# Patient Record
Sex: Female | Born: 1970 | Race: White | Hispanic: No | Marital: Single | Smoking: Current every day smoker
Health system: Southern US, Community
[De-identification: ages and names within clinical notes are randomized; demographics above are authoritative.]

## PROBLEM LIST (undated history)

## (undated) DIAGNOSIS — F419 Anxiety disorder, unspecified: Secondary | ICD-10-CM

## (undated) DIAGNOSIS — F410 Panic disorder [episodic paroxysmal anxiety] without agoraphobia: Secondary | ICD-10-CM

## (undated) DIAGNOSIS — F32A Depression, unspecified: Secondary | ICD-10-CM

## (undated) DIAGNOSIS — F1990 Other psychoactive substance use, unspecified, uncomplicated: Secondary | ICD-10-CM

## (undated) DIAGNOSIS — M5126 Other intervertebral disc displacement, lumbar region: Secondary | ICD-10-CM

## (undated) DIAGNOSIS — Z789 Other specified health status: Secondary | ICD-10-CM

## (undated) DIAGNOSIS — B1920 Unspecified viral hepatitis C without hepatic coma: Secondary | ICD-10-CM

## (undated) DIAGNOSIS — F329 Major depressive disorder, single episode, unspecified: Secondary | ICD-10-CM

## (undated) DIAGNOSIS — Z720 Tobacco use: Secondary | ICD-10-CM

## (undated) HISTORY — PX: CHOLECYSTECTOMY: SHX55

## (undated) HISTORY — PX: KNEE SURGERY: SHX244

---

## 2008-11-21 ENCOUNTER — Emergency Department (HOSPITAL_COMMUNITY): Admission: EM | Admit: 2008-11-21 | Discharge: 2008-11-21 | Payer: Self-pay | Admitting: Emergency Medicine

## 2008-12-10 ENCOUNTER — Emergency Department (HOSPITAL_BASED_OUTPATIENT_CLINIC_OR_DEPARTMENT_OTHER): Admission: EM | Admit: 2008-12-10 | Discharge: 2008-12-11 | Payer: Self-pay | Admitting: Emergency Medicine

## 2008-12-10 ENCOUNTER — Ambulatory Visit: Payer: Self-pay | Admitting: Diagnostic Radiology

## 2008-12-23 ENCOUNTER — Ambulatory Visit: Payer: Self-pay | Admitting: Diagnostic Radiology

## 2008-12-23 ENCOUNTER — Emergency Department (HOSPITAL_BASED_OUTPATIENT_CLINIC_OR_DEPARTMENT_OTHER): Admission: EM | Admit: 2008-12-23 | Discharge: 2008-12-23 | Payer: Self-pay | Admitting: Emergency Medicine

## 2009-01-28 DIAGNOSIS — M5126 Other intervertebral disc displacement, lumbar region: Secondary | ICD-10-CM

## 2009-01-28 HISTORY — DX: Other intervertebral disc displacement, lumbar region: M51.26

## 2009-09-04 ENCOUNTER — Emergency Department (HOSPITAL_BASED_OUTPATIENT_CLINIC_OR_DEPARTMENT_OTHER): Admission: EM | Admit: 2009-09-04 | Discharge: 2009-09-04 | Payer: Self-pay | Admitting: Emergency Medicine

## 2009-11-30 ENCOUNTER — Emergency Department (HOSPITAL_BASED_OUTPATIENT_CLINIC_OR_DEPARTMENT_OTHER): Admission: EM | Admit: 2009-11-30 | Discharge: 2009-11-30 | Payer: Self-pay | Admitting: Emergency Medicine

## 2009-11-30 ENCOUNTER — Ambulatory Visit: Payer: Self-pay | Admitting: Diagnostic Radiology

## 2009-12-12 ENCOUNTER — Emergency Department (HOSPITAL_BASED_OUTPATIENT_CLINIC_OR_DEPARTMENT_OTHER): Admission: EM | Admit: 2009-12-12 | Discharge: 2009-12-12 | Payer: Self-pay | Admitting: Emergency Medicine

## 2010-01-23 ENCOUNTER — Emergency Department (HOSPITAL_BASED_OUTPATIENT_CLINIC_OR_DEPARTMENT_OTHER)
Admission: EM | Admit: 2010-01-23 | Discharge: 2010-01-23 | Payer: Self-pay | Source: Home / Self Care | Admitting: Emergency Medicine

## 2010-04-10 LAB — URINALYSIS, ROUTINE W REFLEX MICROSCOPIC
Bilirubin Urine: NEGATIVE
Ketones, ur: NEGATIVE mg/dL
Nitrite: NEGATIVE
Specific Gravity, Urine: 1.01 (ref 1.005–1.030)
pH: 7 (ref 5.0–8.0)

## 2010-04-10 LAB — BASIC METABOLIC PANEL
BUN: 14 mg/dL (ref 6–23)
Chloride: 106 mEq/L (ref 96–112)
Creatinine, Ser: 0.8 mg/dL (ref 0.4–1.2)
GFR calc non Af Amer: >60 mL/min (ref >60)
Glucose, Bld: 101 mg/dL — ABNORMAL HIGH (ref 70–99)
Potassium: 4.2 mEq/L (ref 3.5–5.1)

## 2010-04-10 LAB — URINE MICROSCOPIC-ADD ON

## 2010-04-10 LAB — PREGNANCY, URINE: Preg Test, Ur: NEGATIVE

## 2010-05-02 LAB — POCT CARDIAC MARKERS
CKMB, poc: 1.5 ng/mL (ref 1.0–8.0)
CKMB, poc: 1.8 ng/mL (ref 1.0–8.0)
Myoglobin, poc: 44.7 ng/mL (ref 12–200)
Myoglobin, poc: 52.9 ng/mL (ref 12–200)
Myoglobin, poc: 97 ng/mL (ref 12–200)
Troponin i, poc: <0.05 ng/mL (ref 0.00–0.09)
Troponin i, poc: <0.05 ng/mL (ref 0.00–0.09)

## 2010-05-02 LAB — URINALYSIS, ROUTINE W REFLEX MICROSCOPIC
Bilirubin Urine: NEGATIVE
Glucose, UA: NEGATIVE mg/dL
Ketones, ur: NEGATIVE mg/dL
Nitrite: NEGATIVE
Protein, ur: NEGATIVE mg/dL
Urobilinogen, UA: 0.2 mg/dL (ref 0.0–1.0)
pH: 6.5 (ref 5.0–8.0)

## 2010-05-02 LAB — COMPREHENSIVE METABOLIC PANEL
ALT: 29 U/L (ref 0–35)
AST: 22 U/L (ref 0–37)
Alkaline Phosphatase: 49 U/L (ref 39–117)
CO2: 23 mEq/L (ref 19–32)
Chloride: 105 mEq/L (ref 96–112)
GFR calc Af Amer: >60 mL/min (ref >60)
GFR calc non Af Amer: >60 mL/min (ref >60)
Glucose, Bld: 81 mg/dL (ref 70–99)
Potassium: 4.2 mEq/L (ref 3.5–5.1)
Sodium: 140 mEq/L (ref 135–145)
Total Bilirubin: 0.3 mg/dL (ref 0.3–1.2)

## 2010-05-02 LAB — DIFFERENTIAL
Basophils Absolute: 0.3 10*3/uL — ABNORMAL HIGH (ref 0.0–0.1)
Basophils Relative: 3 % — ABNORMAL HIGH (ref 0–1)
Eosinophils Absolute: 0.1 10*3/uL (ref 0.0–0.7)
Eosinophils Relative: 1 % (ref 0–5)
Lymphs Abs: 2 10*3/uL (ref 0.7–4.0)
Neutrophils Relative %: 70 % (ref 43–77)

## 2010-05-02 LAB — CBC
Hemoglobin: 12.7 g/dL (ref 12.0–15.0)
MCHC: 34.8 g/dL (ref 30.0–36.0)
RBC: 4.03 MIL/uL (ref 3.87–5.11)
RDW: 12.6 % (ref 11.5–15.5)
WBC: 10 10*3/uL (ref 4.0–10.5)

## 2010-05-02 LAB — LIPASE, BLOOD: Lipase: 56 U/L (ref 23–300)

## 2010-05-18 ENCOUNTER — Emergency Department (INDEPENDENT_AMBULATORY_CARE_PROVIDER_SITE_OTHER): Payer: Medicaid Other

## 2010-05-18 ENCOUNTER — Emergency Department (HOSPITAL_BASED_OUTPATIENT_CLINIC_OR_DEPARTMENT_OTHER)
Admission: EM | Admit: 2010-05-18 | Discharge: 2010-05-18 | Disposition: A | Discharge status: Home / Self Care | Payer: Medicaid Other | Attending: Emergency Medicine | Admitting: Emergency Medicine

## 2010-05-18 DIAGNOSIS — Y9341 Activity, dancing: Secondary | ICD-10-CM

## 2010-05-18 DIAGNOSIS — G8929 Other chronic pain: Secondary | ICD-10-CM | POA: Insufficient documentation

## 2010-05-18 DIAGNOSIS — F411 Generalized anxiety disorder: Secondary | ICD-10-CM | POA: Insufficient documentation

## 2010-05-18 DIAGNOSIS — W19XXXA Unspecified fall, initial encounter: Secondary | ICD-10-CM

## 2010-05-18 DIAGNOSIS — S20229A Contusion of unspecified back wall of thorax, initial encounter: Secondary | ICD-10-CM

## 2010-05-18 DIAGNOSIS — Y92009 Unspecified place in unspecified non-institutional (private) residence as the place of occurrence of the external cause: Secondary | ICD-10-CM | POA: Insufficient documentation

## 2010-08-23 ENCOUNTER — Emergency Department (HOSPITAL_BASED_OUTPATIENT_CLINIC_OR_DEPARTMENT_OTHER)
Admission: EM | Admit: 2010-08-23 | Discharge: 2010-08-23 | Disposition: A | Discharge status: Home / Self Care | Payer: Medicaid Other | Attending: Emergency Medicine | Admitting: Emergency Medicine

## 2010-08-23 ENCOUNTER — Emergency Department (INDEPENDENT_AMBULATORY_CARE_PROVIDER_SITE_OTHER): Payer: Medicaid Other

## 2010-08-23 DIAGNOSIS — M25579 Pain in unspecified ankle and joints of unspecified foot: Secondary | ICD-10-CM

## 2010-08-23 DIAGNOSIS — W19XXXA Unspecified fall, initial encounter: Secondary | ICD-10-CM

## 2010-08-23 DIAGNOSIS — R209 Unspecified disturbances of skin sensation: Secondary | ICD-10-CM | POA: Insufficient documentation

## 2010-08-23 DIAGNOSIS — M7989 Other specified soft tissue disorders: Secondary | ICD-10-CM

## 2010-08-23 DIAGNOSIS — S93409A Sprain of unspecified ligament of unspecified ankle, initial encounter: Secondary | ICD-10-CM | POA: Insufficient documentation

## 2010-08-23 DIAGNOSIS — S93401A Sprain of unspecified ligament of right ankle, initial encounter: Secondary | ICD-10-CM

## 2010-08-23 DIAGNOSIS — M1710 Unilateral primary osteoarthritis, unspecified knee: Secondary | ICD-10-CM

## 2010-08-23 DIAGNOSIS — M25569 Pain in unspecified knee: Secondary | ICD-10-CM

## 2010-08-23 DIAGNOSIS — X500XXA Overexertion from strenuous movement or load, initial encounter: Secondary | ICD-10-CM | POA: Insufficient documentation

## 2010-08-23 MED ORDER — HYDROCODONE-ACETAMINOPHEN 5-325 MG PO TABS
1.0000 | ORAL_TABLET | Freq: Four times a day (QID) | ORAL | Status: AC | PRN
Start: 2010-08-23 — End: 2010-09-02

## 2010-08-23 NOTE — ED Notes (Signed)
Socks and PO fluids provided.  Pt resting quietly.

## 2010-08-23 NOTE — ED Notes (Signed)
MD at bedside. 

## 2010-08-23 NOTE — ED Notes (Signed)
Injury to right ankle 3 weeks ago.  Pt reports increase in pain and swelling to lateral aspect of ankle that increases with weight bearing.

## 2010-08-23 NOTE — ED Provider Notes (Addendum)
History     Chief Complaint  Patient presents with  . Ankle Pain   Patient is a 40 y.o. female presenting with ankle pain. The history is provided by the patient.  Ankle Pain  The incident occurred more than 1 week ago (It occurred the beginning of July). The injury mechanism was torsion. The pain is present in the right ankle. The pain is moderate. The pain has been constant since onset. Associated symptoms include loss of motion and tingling. Pertinent negatives include no numbness, no inability to bear weight and no muscle weakness. She has tried rest and NSAIDs for the symptoms.    No past medical history on file.  No past surgical history on file.  No family history on file.  History  Substance Use Topics  . Smoking status: Not on file  . Smokeless tobacco: Not on file  . Alcohol Use: Not on file    OB History    No data available      Review of Systems  Neurological: Positive for tingling. Negative for numbness.  All other systems reviewed and are negative.    Physical Exam  BP 118/84  Pulse 82  Temp(Src) 99.5 F (37.5 C) (Oral)  Resp 16  Ht 5\' 3"  (1.6 m)  Wt 126 lb (57.153 kg)  BMI 22.32 kg/m2  SpO2 97%  LMP 07/24/2010  Physical Exam  Nursing note and vitals reviewed. Constitutional: She appears well-developed and well-nourished. No distress.  HENT:  Head: Normocephalic and atraumatic.  Right Ear: External ear normal.  Left Ear: External ear normal.  Eyes: Conjunctivae are normal. Right eye exhibits no discharge. Left eye exhibits no discharge. No scleral icterus.  Neck: Neck supple. No tracheal deviation present.  Pulmonary/Chest: Effort normal. No stridor. No respiratory distress.  Musculoskeletal: She exhibits edema and tenderness.       ttp lateral maleolus right ankle, no prox fib ttp, distal nv intact  Neurological: She is alert. Cranial nerve deficit: no gross deficits.  Skin: Skin is warm and dry. No rash noted.  Psychiatric: She has a  normal mood and affect.    ED Course  Procedures Nursing notes,, and x-ray interpretations were reviewed and considered as part of the evaluation and were factored into the medical decision making. Labs Reviewed - No data to display MDM Patient without signs of fracture or dislocation. Explained the results of her knee x-rays to her. Patient states that she did have an old injury in the past and suspect this is the cause for her osteoarthritis. Suspect patient has somewhat of a chronic sprain injury. We will immobilize her and give her crutches. We'll give her a referral to sports medicine.      Celene Kras, MD 08/23/10 1610  Celene Kras, MD 09/21/10 [phone]

## 2010-09-06 ENCOUNTER — Ambulatory Visit: Payer: Self-pay | Admitting: Family Medicine

## 2010-09-06 ENCOUNTER — Ambulatory Visit (INDEPENDENT_AMBULATORY_CARE_PROVIDER_SITE_OTHER): Payer: Medicaid Other | Admitting: Family Medicine

## 2010-09-06 ENCOUNTER — Encounter: Payer: Self-pay | Admitting: Family Medicine

## 2010-09-06 DIAGNOSIS — S99919A Unspecified injury of unspecified ankle, initial encounter: Secondary | ICD-10-CM

## 2010-09-06 DIAGNOSIS — S8991XA Unspecified injury of right lower leg, initial encounter: Secondary | ICD-10-CM

## 2010-09-06 DIAGNOSIS — S99911A Unspecified injury of right ankle, initial encounter: Secondary | ICD-10-CM

## 2010-09-06 NOTE — Patient Instructions (Signed)
The biggest barrier at this point to further workup (MRI) and treatment (physical therapy) is medicaid going through. Once this goes through, call us and we'll talk about next steps. Your ankle pain and exam is consistent with a severe ankle sprain that has not been adequately rehabilitated to this point. Start simple up/down and alphabet exercises 2 sets twice a day out of boot. Ice area 15 minutes at a time 3-4 times a day. ACE wrap can help with swelling under the boot. Crutches as needed. Aleve 2 tabs twice a day with food for pain, swelling, and inflammation.  Your knee pain being diffuse makes discerning the problem difficult. Start with some quad sets and straight leg raises 3 sets of 15 once a day. We will likely have to move forward with MRI of this at some point - typically after 6 weeks of PT/home exercises if you aren't improving. Ice area 15 minutes at a time 3-4 times a day. ACE wrap can help with swelling under the boot. Crutches as needed.

## 2010-09-10 ENCOUNTER — Encounter: Payer: Self-pay | Admitting: Family Medicine

## 2010-09-10 NOTE — Progress Notes (Signed)
Subjective:    Patient ID: Alexa Sims, female    DOB: Jun 09, 1970, 40 y.o.   MRN: [id number]  PCP: Dr. Quintella Reichert  HPI 40 yo F here for right knee and ankle pain.  Patient reports sustaining initial injury on 07/30/10. She was moving a mattress out a door on steep steps when she suffered an inversion injury to right ankle, felt her right knee go medial as well. States both the ankle and knee became swollen as a result, couldn't walk well following this. Went to ED where had ankle, tib-fib and knee x-rays that were negative on 7/26. She has a history of meniscectomy by Dr. Alvera Novel in the past. Reports pain, catching, and locking of right knee mostly on inside of knee.  Hears a tearing/popping noise in knee also. Toes tingling and bottom of foot is numb.  History reviewed. No pertinent past medical history.  Current Outpatient Prescriptions on File Prior to Visit  Medication Sig Dispense Refill  . ibuprofen (ADVIL,MOTRIN) 800 MG tablet Take 800 mg by mouth every 8 (eight) hours as needed.          Past Surgical History  Procedure Date  . Cholecystectomy   . Knee surgery     partial meniscectomy right knee    Allergies  Allergen Reactions  . Aspirin     Bothers stomach  . Tramadol Nausea And Vomiting    History   Social History  . Marital Status: Single    Spouse Name: N/A    Number of Children: N/A  . Years of Education: N/A   Occupational History  . Not on file.   Social History Main Topics  . Smoking status: Current Everyday Smoker -- 1.0 packs/day  . Smokeless tobacco: Not on file  . Alcohol Use: Yes     socially  . Drug Use: No  . Sexually Active: Not on file   Other Topics Concern  . Not on file   Social History Narrative  . No narrative on file    Family History  Problem Relation Age of Onset  . Hyperlipidemia Father   . Hypertension Father   . Heart attack Father   . Rheum arthritis Sister     BP 108/70  Pulse 71  Temp(Src) 98.1 F (36.7  C) (Oral)  Ht 5\' 3"  (1.6 m)  Wt 134 lb 6.4 oz (60.963 kg)  BMI 23.81 kg/m2  LMP 07/24/2010  Review of Systems See HPI above.    Objective:   Physical Exam Gen: NAD  R knee: No gross deformity, ecchymoses, swelling. Diffuse anterior TTP including joint lines, post patellar facets. FROM but guarding with flexion. Negative ant/post drawers. Negative valgus/varus testing. Negative lachmanns. Pain with mcmurrays, apleys, patellar apprehension diffusely anterior knee.  No click with mcmurrays/apleys. NV intact distally.  R ankle: Mild swelling lateral ankle.  No other gross deformity, ecchymoses Able to move in all directions, mildly limited in all planes. TTP medial, lateral malleoli, talar dome, over ATFL, mild over Achilles - fairly diffuse tenderness ankle as well as knee as noted above. 1+ ant drawer and talar tilt.   Negative syndesmotic compression. Thompsons test negative. NV intact distally.  MSK u/s:  Right patellar tendon intact without thickening or neovascularity.  R ankle talar dome intact without fracture.  Post tib, achilles, peroneal tendons appear intact though peroneal tendons have edema surrounding them as they cross ankle joint.      Assessment & Plan:  1. R ankle injury - x-rays reviewed and are  negative.  Tendons appear intact on msk u/s though she does have swelling about peroneal tendons, lateral ankle.  2/2 ankle sprain.  Not having insurance a major barrier to care.  Would start PT at this point but she cannot do this yet - will call when medicaid goes through (should be about 2 weeks) - then will order PT.  Start simple ROM exercises at this point.  Cam walker for stability.  ACE under cam to help with swelling.  Icing, nsaids prn.  2. R knee injury - unable to discern her injury but no objective signs of intraarticular pathology.  Diffuse pain makes this difficult.  Radiographs reviewed and are normal.  Icing, nsaids.  Will likely have to move forward with  MRI when medicaid goes through.  Shown quad sets and straight leg raises to do daily.  ACE wrap for this as well.  Call when medicaid goes through.

## 2010-09-11 ENCOUNTER — Encounter: Payer: Self-pay | Admitting: Family Medicine

## 2010-09-11 DIAGNOSIS — S8991XA Unspecified injury of right lower leg, initial encounter: Secondary | ICD-10-CM | POA: Insufficient documentation

## 2010-09-11 DIAGNOSIS — S99911A Unspecified injury of right ankle, initial encounter: Secondary | ICD-10-CM | POA: Insufficient documentation

## 2010-09-11 NOTE — Assessment & Plan Note (Signed)
x-rays reviewed and are negative.  Tendons appear intact on msk u/s though she does have swelling about peroneal tendons, lateral ankle.  2/2 ankle sprain.  Not having insurance a major barrier to care.  Would start PT at this point but she cannot do this yet - will call when medicaid goes through (should be about 2 weeks) - then will order PT.  Start simple ROM exercises at this point.  Cam walker for stability.  ACE under cam to help with swelling.  Icing, nsaids prn.

## 2010-09-11 NOTE — Assessment & Plan Note (Signed)
unable to discern her injury but no objective signs of intraarticular pathology.  Diffuse pain makes this difficult.  Radiographs reviewed and are normal.  Icing, nsaids.  Will likely have to move forward with MRI when medicaid goes through.  Shown quad sets and straight leg raises to do daily.  ACE wrap for this as well.  Call when medicaid goes through.

## 2010-12-24 ENCOUNTER — Inpatient Hospital Stay (HOSPITAL_COMMUNITY)
Admission: AD | Admit: 2010-12-24 | Discharge: 2010-12-24 | Disposition: A | Discharge status: Home / Self Care | Payer: Medicaid Other | Source: Ambulatory Visit | Attending: Obstetrics and Gynecology | Admitting: Obstetrics and Gynecology

## 2010-12-24 ENCOUNTER — Encounter (HOSPITAL_COMMUNITY): Payer: Self-pay | Admitting: *Deleted

## 2010-12-24 DIAGNOSIS — N949 Unspecified condition associated with female genital organs and menstrual cycle: Secondary | ICD-10-CM

## 2010-12-24 DIAGNOSIS — R109 Unspecified abdominal pain: Secondary | ICD-10-CM | POA: Insufficient documentation

## 2010-12-24 DIAGNOSIS — O99891 Other specified diseases and conditions complicating pregnancy: Secondary | ICD-10-CM | POA: Insufficient documentation

## 2010-12-24 HISTORY — DX: Other specified health status: Z78.9

## 2010-12-24 LAB — URINALYSIS, ROUTINE W REFLEX MICROSCOPIC
Glucose, UA: NEGATIVE mg/dL
Leukocytes, UA: NEGATIVE
Protein, ur: NEGATIVE mg/dL
Specific Gravity, Urine: 1.02 (ref 1.005–1.030)
pH: 6 (ref 5.0–8.0)

## 2010-12-24 MED ORDER — HYDROXYZINE HCL 50 MG/ML IM SOLN
50.0000 mg | Freq: Once | INTRAMUSCULAR | Status: AC
  Administered 2010-12-24: 50 mg via INTRAMUSCULAR
  Filled 2010-12-24: qty 1

## 2010-12-24 MED ORDER — HYDROXYZINE HCL 50 MG/ML IM SOLN
50.0000 mg | Freq: Once | INTRAMUSCULAR | Status: DC
  Filled 2010-12-24: qty 1

## 2010-12-24 NOTE — ED Provider Notes (Signed)
History   Pt presents today c/o lower abd pain and cramping. She states the pain is worse with certain movements. She denies vag dc, bleeding, fever. She does c/o urinary frequency. She denies any other sx at this time.  Chief Complaint  Patient presents with  . Contractions   HPI  OB History    Grav Para Term Preterm Abortions TAB SAB Ect Mult Living   3 2        2       Past Medical History  Diagnosis Date  . No pertinent past medical history     Past Surgical History  Procedure Date  . Cholecystectomy   . Knee surgery     partial meniscectomy right knee    Family History  Problem Relation Age of Onset  . Hyperlipidemia Father   . Hypertension Father   . Heart attack Father   . Rheum arthritis Sister   . Sudden death Paternal Uncle   . Hypertension Maternal Grandmother   . Diabetes Neg Hx     History  Substance Use Topics  . Smoking status: Current Everyday Smoker -- 1.0 packs/day  . Smokeless tobacco: Not on file  . Alcohol Use: No     socially    Allergies:  Allergies  Allergen Reactions  . Aspirin     Bothers stomach  . Tramadol Nausea And Vomiting    Prescriptions prior to admission  Medication Sig Dispense Refill  . gabapentin (NEURONTIN) 100 MG capsule Take 100 mg by mouth daily.        Marland Kitchen HYDROcodone-acetaminophen (NORCO) 5-325 MG per tablet Take 1 tablet by mouth every 6 (six) hours as needed. For pain       . prenatal vitamin w/FE, FA (PRENATAL 1 + 1) 27-1 MG TABS Take 1 tablet by mouth daily.          Review of Systems  Constitutional: Negative for fever.  Cardiovascular: Negative for chest pain and palpitations.  Gastrointestinal: Positive for abdominal pain. Negative for nausea, vomiting, diarrhea and constipation.  Genitourinary: Positive for frequency. Negative for dysuria, urgency and hematuria.  Neurological: Negative for dizziness and headaches.  Psychiatric/Behavioral: Negative for depression and suicidal ideas.   Physical Exam    Blood pressure 121/71, pulse 77, temperature 98.1 F (36.7 C), resp. rate 16, height 5\' 3"  (1.6 m), weight 146 lb 3.2 oz (66.316 kg), last menstrual period 07/24/2010.  Physical Exam  Nursing note and vitals reviewed. Constitutional: She is oriented to person, place, and time. She appears well-developed and well-nourished. No distress.  HENT:  Head: Normocephalic and atraumatic.  Eyes: EOM are normal. Pupils are equal, round, and reactive to light.  GI: Soft. She exhibits no distension. There is no tenderness. There is no rebound and no guarding.  Genitourinary: No bleeding around the vagina. No vaginal discharge found.       Cervix Lg/closed.  Neurological: She is alert and oriented to person, place, and time.  Skin: Skin is warm and dry. She is not diaphoretic.  Psychiatric: She has a normal mood and affect. Her behavior is normal. Judgment and thought content normal.    MAU Course  Procedures  Discussed pt with Dr. Claiborne Billings. Ok to dc to home with precautions.  Results for orders placed during the hospital encounter of 12/24/10 (from the past 24 hour(s))  URINALYSIS, ROUTINE W REFLEX MICROSCOPIC     Status: Normal   Collection Time   12/24/10 10:45 PM      Component Value Range  Color, Urine YELLOW  YELLOW    Appearance CLEAR  CLEAR    Specific Gravity, Urine 1.020  1.005 - 1.030    pH 6.0  5.0 - 8.0    Glucose, UA NEGATIVE  NEGATIVE (mg/dL)   Hgb urine dipstick NEGATIVE  NEGATIVE    Bilirubin Urine NEGATIVE  NEGATIVE    Ketones, ur NEGATIVE  NEGATIVE (mg/dL)   Protein, ur NEGATIVE  NEGATIVE (mg/dL)   Urobilinogen, UA 0.2  0.0 - 1.0 (mg/dL)   Nitrite NEGATIVE  NEGATIVE    Leukocytes, UA NEGATIVE  NEGATIVE      Assessment and Plan  Round ligament pain: discussed with pt at length. She will f/u with her OB provider. Discussed diet, activity, risks, and precautions.  Clinton Gallant. Maxwel Meadowcroft III, DrHSc, MPAS, PA-C  12/24/2010, 11:19 PM   Henrietta Hoover, PA 12/24/10 2346

## 2010-12-24 NOTE — Progress Notes (Signed)
Pt G3 P2 at 17.2wks, having contractions since last pm.  Denies bleeding or discharge.

## 2010-12-26 ENCOUNTER — Other Ambulatory Visit: Payer: Self-pay | Admitting: Obstetrics and Gynecology

## 2010-12-26 ENCOUNTER — Ambulatory Visit: Payer: Medicaid Other | Attending: Obstetrics and Gynecology | Admitting: Physical Therapy

## 2010-12-31 ENCOUNTER — Other Ambulatory Visit (HOSPITAL_COMMUNITY): Payer: Self-pay | Admitting: Obstetrics and Gynecology

## 2010-12-31 DIAGNOSIS — O280 Abnormal hematological finding on antenatal screening of mother: Secondary | ICD-10-CM

## 2010-12-31 DIAGNOSIS — O09529 Supervision of elderly multigravida, unspecified trimester: Secondary | ICD-10-CM

## 2011-01-01 ENCOUNTER — Ambulatory Visit (HOSPITAL_COMMUNITY)
Admission: RE | Admit: 2011-01-01 | Discharge: 2011-01-01 | Disposition: A | Discharge status: Home / Self Care | Payer: Medicaid Other | Source: Ambulatory Visit | Attending: Obstetrics and Gynecology | Admitting: Obstetrics and Gynecology

## 2011-01-01 DIAGNOSIS — O280 Abnormal hematological finding on antenatal screening of mother: Secondary | ICD-10-CM

## 2011-01-01 DIAGNOSIS — O358XX0 Maternal care for other (suspected) fetal abnormality and damage, not applicable or unspecified: Secondary | ICD-10-CM | POA: Insufficient documentation

## 2011-01-01 DIAGNOSIS — O09529 Supervision of elderly multigravida, unspecified trimester: Secondary | ICD-10-CM | POA: Insufficient documentation

## 2011-01-01 DIAGNOSIS — O289 Unspecified abnormal findings on antenatal screening of mother: Secondary | ICD-10-CM | POA: Insufficient documentation

## 2011-01-01 DIAGNOSIS — Z1389 Encounter for screening for other disorder: Secondary | ICD-10-CM | POA: Insufficient documentation

## 2011-01-01 DIAGNOSIS — Z363 Encounter for antenatal screening for malformations: Secondary | ICD-10-CM | POA: Insufficient documentation

## 2011-01-01 NOTE — Progress Notes (Signed)
Alexa Sims was seen for ultrasound appointment today.  Please see [facility] report for details.

## 2011-01-01 NOTE — Progress Notes (Signed)
Genetic Counseling  High-Risk Gestation Note  Appointment Date:  01/01/2011 Referred By: Loney Laurence, MD Date of Birth:  Mar 19, 1970 Partner:  [name]  Pregnancy History: G3P2 Estimated Date of Delivery: 06/01/11 Estimated Gestational Age: [redacted]w[redacted]d Attending: Berna Spare, MD  Alexa Sims and her partner, Alexa Sims, were seen for genetic counseling regarding a maternal age of 40 y.o. and an increased risk for Down syndrome based on Quad screen.  They were counseled regarding maternal age and the association with risk for chromosome conditions due to nondisjunction with aging of the ova.   We reviewed chromosomes, nondisjunction, and the associated 1 in 43 risk for fetal aneuploidy in the second trimester related to a maternal age of 6 at delivery.  They were counseled that the risk for aneuploidy decreases as gestational age increases, accounting for those pregnancies which spontaneously abort.  We specifically discussed Down syndrome (trisomy 51), trisomies 40 and 70, and sex chromosome aneuploidies (47,XXX and 47,XXY) including the common features and prognoses of each.   We also reviewed Alexa Sims's maternal serum Quad screen result and the associated increase in risk for fetal Down syndrome (1 in 110 to 1 in 22).  They were counseled regarding other explanations for a screen positive result including normal variation and differences in maternal metabolism.  In addition, we reviewed the screen adjusted reduction in risks for trisomy 18 (less than 1 in 5000) and ONTDs (less than 1 in 5000).  They understand that Quad screening provides a pregnancy specific risk for Down syndrome, but is not considered to be diagnostic.  They were counseled regarding other available screening and diagnostic options including ultrasound and amniocentesis.  The risks, benefits, and limitations of each of these options were reviewed in detail.  After thoughtful consideration of these options, they  elected to proceed with ultrasound, but declined amniocentesis.  A complete detailed ultrasound was performed today.  The ultrasound report will be sent under separate cover.  They understand that screening tests cannot rule out all birth defects or genetic syndromes.  The patient was advised of this limitation and states she still does not want diagnostic testing at this time given the associated risk of complications.  However, they were counseled that 50-80% of fetuses with Down syndrome and up to 90% of fetuses with trisomies 13 and 18, when well visualized, have detectable anomalies or soft markers by ultrasound.    Alexa Sims was provided with written information regarding cystic fibrosis (CF) including the carrier frequency and incidence in the Caucasian population, the availability of carrier testing and prenatal diagnosis if indicated.  In addition, we discussed that CF is routinely screened for as part of the [place] newborn screening panel.  She declined testing today.   Both family histories were reviewed and found to be noncontributory for birth defects, mental retardation, and known genetic conditions. Without further information regarding the provided family history, an accurate genetic risk cannot be calculated. Further genetic counseling is warranted if more information is obtained.  Alexa Sims denied exposure to environmental toxins. She reported taking Neurontin and hydrocodone as needed for back pain and reported that she has not taken these recently. Although limited, current available data on prenatal use of hydrocodone during human pregnancy do not suggest an increased risk of birth defects. Available data regarding prenatal use of gabapentin are limited. Gabapentin has been reported to interfere with embryo development in some but not all experimental animal studies. Limited reports of gabapentin use in humans  include normal pregnancy outcomes in the majority but also include reports  of malformations. Data are currently too limited to elucidate whether prenatal gabapentin use increases the risk for birth defects.  She denied the use of alcohol or street drugs. She reported smoking approximately less than one cigarette per day. The associations of smoking in pregnancy were reviewed and cessation encouraged. She denied significant viral illnesses during the course of her pregnancy. Her medical and surgical histories were contributory for 1 TAB.     I counseled this couple regarding the above risks and available options.  The approximate face-to-face time with the genetic counselor was 30 minutes.    Quinn Plowman, MS,  Certified Genetic Counselor 01/01/2011

## 2011-01-02 NOTE — Progress Notes (Signed)
Encounter addended by: Marlana Latus, RN on: 01/02/2011 10:04 AM<BR>     Documentation filed: Episodes, Chief Complaint Section

## 2011-05-09 LAB — OB RESULTS CONSOLE GBS: GBS: NEGATIVE

## 2011-06-03 ENCOUNTER — Encounter (HOSPITAL_COMMUNITY): Payer: Self-pay | Admitting: *Deleted

## 2011-06-03 ENCOUNTER — Inpatient Hospital Stay (HOSPITAL_COMMUNITY)
Admission: AD | Admit: 2011-06-03 | Discharge: 2011-06-03 | Disposition: A | Discharge status: Home / Self Care | Payer: Medicaid Other | Source: Ambulatory Visit | Attending: Obstetrics and Gynecology | Admitting: Obstetrics and Gynecology

## 2011-06-03 DIAGNOSIS — O479 False labor, unspecified: Secondary | ICD-10-CM | POA: Insufficient documentation

## 2011-06-03 DIAGNOSIS — R109 Unspecified abdominal pain: Secondary | ICD-10-CM | POA: Insufficient documentation

## 2011-06-03 HISTORY — DX: Other intervertebral disc displacement, lumbar region: M51.26

## 2011-06-03 HISTORY — DX: Depression, unspecified: F32.A

## 2011-06-03 HISTORY — DX: Major depressive disorder, single episode, unspecified: F32.9

## 2011-06-03 NOTE — MAU Provider Note (Signed)
  History     CSN: [id number]  Arrival date and time: 06/03/11 1429   First Provider Initiated Contact with Patient 06/03/11 1538      Chief Complaint  Patient presents with  . Labor Eval   HPI Alexa Sims is 41 y.o. G3P2 [redacted]w[redacted]d weeks presenting with abdominal pain and now feels like she is contractions.  She feels like they are 10-15 minutes apart.  A little sharper than normal.  Ferning here is negative.   Patient is comfortable at the moment.  Dr. Tenny Craw is aware that patient is here and has given orders for her to walk.      Past Medical History  Diagnosis Date  . No pertinent past medical history   . Lumbar herniated disc 2011    L5    Past Surgical History  Procedure Date  . Cholecystectomy   . Knee surgery     partial meniscectomy right knee    Family History  Problem Relation Age of Onset  . Hyperlipidemia Father   . Hypertension Father   . Heart attack Father   . Rheum arthritis Sister   . Sudden death Paternal Uncle   . Hypertension Maternal Grandmother   . Diabetes Neg Hx   . Anesthesia problems Neg Hx   . Birth defects Brother     History  Substance Use Topics  . Smoking status: Current Everyday Smoker -- 0.2 packs/day    Types: Cigarettes  . Smokeless tobacco: Never Used  . Alcohol Use: No     socially    Allergies:  Allergies  Allergen Reactions  . Aspirin     Bothers stomach  . Tramadol Nausea And Vomiting and Other (See Comments)    Blurred vision.    Prescriptions prior to admission  Medication Sig Dispense Refill  . HYDROcodone-acetaminophen (NORCO) 5-325 MG per tablet Take 1 tablet by mouth every 6 (six) hours as needed. For pain       . Prenatal Vit-Fe Fumarate-FA (PRENATAL MULTIVITAMIN) TABS Take 1 tablet by mouth every morning.        ROS Physical Exam   Blood pressure 111/59, pulse 81, temperature 98.4 F (36.9 C), temperature source Oral, resp. rate 18, height 5\' 3"  (1.6 m), weight 71.668 kg (158 lb), last menstrual period  08/25/2010, SpO2 100.00%, unknown if currently breastfeeding.  Physical Exam  MAU Course  Procedures  MDM   Assessment and Plan    Alexa Sims,EVE M 06/03/2011, 3:38 PM

## 2011-06-03 NOTE — MAU Note (Signed)
Pt states she's been having sharp pains since 0600 this morning in lower abd and lower back pain.

## 2011-06-03 NOTE — Discharge Instructions (Signed)
Fetal Movement Counts Patient Name: __________________________________________________ Patient Due Date: ____________________ Kick counts is highly recommended in high risk pregnancies, but it is a good idea for every pregnant woman to do. Start counting fetal movements at 28 weeks of the pregnancy. Fetal movements increase after eating a full meal or eating or drinking something sweet (the blood sugar is higher). It is also important to drink plenty of fluids (well hydrated) before doing the count. Lie on your left side because it helps with the circulation or you can sit in a comfortable chair with your arms over your belly (abdomen) with no distractions around you. DOING THE COUNT  Try to do the count the same time of day each time you do it.   Mark the day and time, then see how long it takes for you to feel 10 movements (kicks, flutters, swishes, rolls). You should have at least 10 movements within 2 hours. You will most likely feel 10 movements in much less than 2 hours. If you do not, wait an hour and count again. After a couple of days you will see a pattern.   What you are looking for is a change in the pattern or not enough counts in 2 hours. Is it taking longer in time to reach 10 movements?  SEEK MEDICAL CARE IF:  You feel less than 10 counts in 2 hours. Tried twice.   No movement in one hour.   The pattern is changing or taking longer each day to reach 10 counts in 2 hours.   You feel the baby is not moving as it usually does.  Date: ____________ Movements: ____________ Start time: ____________ Finish time: ____________  Date: ____________ Movements: ____________ Start time: ____________ Finish time: ____________ Date: ____________ Movements: ____________ Start time: ____________ Finish time: ____________ Date: ____________ Movements: ____________ Start time: ____________ Finish time: ____________ Date: ____________ Movements: ____________ Start time: ____________ Finish time:  ____________ Date: ____________ Movements: ____________ Start time: ____________ Finish time: ____________ Date: ____________ Movements: ____________ Start time: ____________ Finish time: ____________ Date: ____________ Movements: ____________ Start time: ____________ Finish time: ____________  Date: ____________ Movements: ____________ Start time: ____________ Finish time: ____________ Date: ____________ Movements: ____________ Start time: ____________ Finish time: ____________ Date: ____________ Movements: ____________ Start time: ____________ Finish time: ____________ Date: ____________ Movements: ____________ Start time: ____________ Finish time: ____________ Date: ____________ Movements: ____________ Start time: ____________ Finish time: ____________ Date: ____________ Movements: ____________ Start time: ____________ Finish time: ____________ Date: ____________ Movements: ____________ Start time: ____________ Finish time: ____________  Date: ____________ Movements: ____________ Start time: ____________ Finish time: ____________ Date: ____________ Movements: ____________ Start time: ____________ Finish time: ____________ Date: ____________ Movements: ____________ Start time: ____________ Finish time: ____________ Date: ____________ Movements: ____________ Start time: ____________ Finish time: ____________ Date: ____________ Movements: ____________ Start time: ____________ Finish time: ____________ Date: ____________ Movements: ____________ Start time: ____________ Finish time: ____________ Date: ____________ Movements: ____________ Start time: ____________ Finish time: ____________  Date: ____________ Movements: ____________ Start time: ____________ Finish time: ____________ Date: ____________ Movements: ____________ Start time: ____________ Finish time: ____________ Date: ____________ Movements: ____________ Start time: ____________ Finish time: ____________ Date: ____________ Movements:  ____________ Start time: ____________ Finish time: ____________ Date: ____________ Movements: ____________ Start time: ____________ Finish time: ____________ Date: ____________ Movements: ____________ Start time: ____________ Finish time: ____________ Date: ____________ Movements: ____________ Start time: ____________ Finish time: ____________  Date: ____________ Movements: ____________ Start time: ____________ Finish time: ____________ Date: ____________ Movements: ____________ Start time: ____________ Finish time: ____________ Date: ____________ Movements: ____________ Start time:   ____________ Finish time: ____________ Date: ____________ Movements: ____________ Start time: ____________ Finish time: ____________ Date: ____________ Movements: ____________ Start time: ____________ Finish time: ____________ Date: ____________ Movements: ____________ Start time: ____________ Finish time: ____________ Date: ____________ Movements: ____________ Start time: ____________ Finish time: ____________  Date: ____________ Movements: ____________ Start time: ____________ Finish time: ____________ Date: ____________ Movements: ____________ Start time: ____________ Finish time: ____________ Date: ____________ Movements: ____________ Start time: ____________ Finish time: ____________ Date: ____________ Movements: ____________ Start time: ____________ Finish time: ____________ Date: ____________ Movements: ____________ Start time: ____________ Finish time: ____________ Date: ____________ Movements: ____________ Start time: ____________ Finish time: ____________ Date: ____________ Movements: ____________ Start time: ____________ Finish time: ____________  Date: ____________ Movements: ____________ Start time: ____________ Finish time: ____________ Date: ____________ Movements: ____________ Start time: ____________ Finish time: ____________ Date: ____________ Movements: ____________ Start time: ____________ Finish  time: ____________ Date: ____________ Movements: ____________ Start time: ____________ Finish time: ____________ Date: ____________ Movements: ____________ Start time: ____________ Finish time: ____________ Date: ____________ Movements: ____________ Start time: ____________ Finish time: ____________ Date: ____________ Movements: ____________ Start time: ____________ Finish time: ____________  Date: ____________ Movements: ____________ Start time: ____________ Finish time: ____________ Date: ____________ Movements: ____________ Start time: ____________ Finish time: ____________ Date: ____________ Movements: ____________ Start time: ____________ Finish time: ____________ Date: ____________ Movements: ____________ Start time: ____________ Finish time: ____________ Date: ____________ Movements: ____________ Start time: ____________ Finish time: ____________ Date: ____________ Movements: ____________ Start time: ____________ Finish time: ____________ Document Released: 02/13/2006 Document Revised: 01/03/2011 Document Reviewed: 08/16/2008 [employer] Patient Information 2012 [employer].Early Elective Birth Early elective birth refers to making a choice to have a baby before the time the baby is due. The length of a pregnancy is 9 months, or 40 weeks starting from the beginning of a woman's last menstrual period (LMP). Most women naturally go into labor around 40 weeks. A "full-term" pregnancy is considered between 37 and [redacted] weeks gestation age. Currently, early elective births can take place sometime after 39 weeks. Most caregivers practice within the guidelines of delivering a baby no later than 42 weeks and no earlier than 39 weeks. There are always exceptions to this time interval, and the risks involved to the mother and baby need to be considered in those cases.  Induction of labor refers to the use of medicines to bring about contractions. "Labor" is when the cervix starts to widen (dilate).  "Active labor" is when there are contractions and the cervix has dilated to at least 4 cm. Often times, the earlier a mother is in her pregnancy, the longer it takes to get induced. When the cervix is ready (dilated and soft), an induction may take less than a day. However, when a cervix is far away from being ready (long, closed, and firm), it may take days in a hospital for labor to start.  Currently, 39 weeks is considered the earliest a caregivershould start the induction process. This is because the longer the baby stays inside the uterus, the lower the risks are to both the baby and mother. However, sometimes there are very good reasons for a pregnancy to be induced before [redacted] weeks gestation. These exceptions are specific to each individual pregnancy and need to be considered on a case-by-case basis. A good reason to induce one pregnancy may not be good a good reason for another pregnancy.  REASONS FOR ELECTIVE BIRTH It may be safer to induce labor before 39 weeks if:   A woman is carrying more than 1 baby. Current standards are to deliver   twin pregnancies at 38 weeks.   A woman is having complications, such as:   High blood pressure caused by pregnancy (preeclampsia).   Bleeding.   Infection.   There are conditions affecting the baby's health, such as:   Intrauterine growth restriction (IUGR), where the baby is not growing well.   Having abnormal fetal heart rate patterns on the monitor (nonreassuring tracing).   Having a lack of fluid that surrounds the baby (oligohydramnios).   Having placental issues.   Fluid that surrounds the baby (amniotic fluid) is leaking.  There are many other safety reasons that a pregnancy may need to be induced early. REASONS AGAINST ELECTIVE BIRTH Sometimes early elective birth is not the best choice. It may not be a good idea if:   An early birth is just more convenient.   You want the baby to be born on a certain date, like a holiday.   You  are more likely to need a cesarean delivery before 39 weeks.  A cesarean delivery can lead to other problems. Problems include infection, bleeding, and not having enough iron in your blood (anemia), which can cause weakness.  Babies born early (34 to 37 weeks premature):  May need special care at the hospital or in a special care nursery.   Are at a greater risk for:   Brain damage.   Feeding problems.   Breathing problems.   Slow physical and mental development.   May need special care in a neonatal intensive care unit (NICU), but this is rare. The length of the baby's stay in the hospital will depend on how quickly he or she progresses to a safe level of care.   Are at a greater risk for:   Infection.   Bleeding inside the brain.   Dying during their first year of life.  REDUCING EARLY ELECTIVE BIRTHS Carrying a baby longer than 42 weeks is not good for the baby or the mother. A full-term pregnancy is best for baby and mother. Anything earlier can be risky for you and your baby. Remember:  An early elective birth may lead to a cesarean delivery. This can lead to other problems for the mother and baby.   An early elective birth can result in developmental problems for your child.   A baby's brain continues to develop while in the uterus.   A baby's body continues to develop. The baby will be better able to breathe and eat when he or she is born near the due date.   A baby who stays in the uterus longer responds better. The baby will also bond better with you.  Document Released: 09/26/2010 Document Revised: 01/03/2011 Document Reviewed: 09/26/2010 [employer] Patient Information 2012 [employer]. 

## 2011-06-04 ENCOUNTER — Encounter (HOSPITAL_COMMUNITY): Payer: Self-pay | Admitting: *Deleted

## 2011-06-04 ENCOUNTER — Inpatient Hospital Stay (HOSPITAL_COMMUNITY)
Admission: AD | Admit: 2011-06-04 | Discharge: 2011-06-04 | Disposition: A | Discharge status: Home / Self Care | Payer: Medicaid Other | Source: Ambulatory Visit | Attending: Obstetrics and Gynecology | Admitting: Obstetrics and Gynecology

## 2011-06-04 DIAGNOSIS — O479 False labor, unspecified: Secondary | ICD-10-CM | POA: Insufficient documentation

## 2011-06-04 MED ORDER — ZOLPIDEM TARTRATE 10 MG PO TABS
10.0000 mg | ORAL_TABLET | Freq: Once | ORAL | Status: AC
  Administered 2011-06-04: 10 mg via ORAL
  Filled 2011-06-04: qty 1

## 2011-06-04 NOTE — MAU Note (Signed)
Pt G3 P2 at 40.3wks having contractions.  SVE 2/60 today.

## 2011-06-04 NOTE — Discharge Instructions (Signed)

## 2011-06-06 ENCOUNTER — Encounter (HOSPITAL_COMMUNITY): Payer: Self-pay | Admitting: *Deleted

## 2011-06-06 ENCOUNTER — Inpatient Hospital Stay (HOSPITAL_COMMUNITY)
Admission: AD | Admit: 2011-06-06 | Discharge: 2011-06-06 | Disposition: A | Discharge status: Home / Self Care | Payer: Medicaid Other | Source: Ambulatory Visit | Attending: Obstetrics and Gynecology | Admitting: Obstetrics and Gynecology

## 2011-06-06 ENCOUNTER — Inpatient Hospital Stay (HOSPITAL_COMMUNITY)
Admission: AD | Admit: 2011-06-06 | Discharge: 2011-06-10 | DRG: 766 | Disposition: A | Discharge status: Home / Self Care | Payer: Medicaid Other | Source: Ambulatory Visit | Attending: Obstetrics and Gynecology | Admitting: Obstetrics and Gynecology

## 2011-06-06 DIAGNOSIS — O09529 Supervision of elderly multigravida, unspecified trimester: Secondary | ICD-10-CM | POA: Diagnosis present

## 2011-06-06 DIAGNOSIS — O479 False labor, unspecified: Secondary | ICD-10-CM | POA: Insufficient documentation

## 2011-06-06 LAB — CBC
Hemoglobin: 10.7 g/dL — ABNORMAL LOW (ref 12.0–15.0)
Platelets: 228 10*3/uL (ref 150–400)
RDW: 13.8 % (ref 11.5–15.5)

## 2011-06-06 LAB — OB RESULTS CONSOLE HIV ANTIBODY (ROUTINE TESTING): HIV: NONREACTIVE

## 2011-06-06 LAB — OB RESULTS CONSOLE GC/CHLAMYDIA
Chlamydia: NEGATIVE
Gonorrhea: NEGATIVE

## 2011-06-06 LAB — OB RESULTS CONSOLE RPR: RPR: NONREACTIVE

## 2011-06-06 MED ORDER — LACTATED RINGERS IV SOLN: 500.0000 mL | INTRAVENOUS | Status: DC | PRN

## 2011-06-06 MED ORDER — OXYTOCIN BOLUS FROM INFUSION
500.0000 mL | Freq: Once | INTRAVENOUS | Status: DC
  Filled 2011-06-06: qty 500

## 2011-06-06 MED ORDER — EPHEDRINE 5 MG/ML INJ: 10.0000 mg | INTRAVENOUS | Status: DC | PRN

## 2011-06-06 MED ORDER — SODIUM BICARBONATE 8.4 % IV SOLN
INTRAVENOUS | Status: DC | PRN
  Administered 2011-06-06: 4 mL via EPIDURAL

## 2011-06-06 MED ORDER — PHENYLEPHRINE 40 MCG/ML (10ML) SYRINGE FOR IV PUSH (FOR BLOOD PRESSURE SUPPORT): 80.0000 ug | PREFILLED_SYRINGE | INTRAVENOUS | Status: DC | PRN

## 2011-06-06 MED ORDER — FLEET ENEMA 7-19 GM/118ML RE ENEM: 1.0000 | ENEMA | RECTAL | Status: DC | PRN

## 2011-06-06 MED ORDER — ACETAMINOPHEN 325 MG PO TABS: 650.0000 mg | ORAL_TABLET | ORAL | Status: DC | PRN

## 2011-06-06 MED ORDER — LIDOCAINE HCL (PF) 1 % IJ SOLN
30.0000 mL | INTRAMUSCULAR | Status: DC | PRN
  Filled 2011-06-06: qty 30

## 2011-06-06 MED ORDER — FENTANYL 2.5 MCG/ML BUPIVACAINE 1/10 % EPIDURAL INFUSION (WH - ANES)
INTRAMUSCULAR | Status: DC | PRN
  Administered 2011-06-06: 21:00:00 via EPIDURAL
  Administered 2011-06-06: 14 mL/h via EPIDURAL
  Administered 2011-06-06: via EPIDURAL

## 2011-06-06 MED ORDER — FENTANYL 2.5 MCG/ML BUPIVACAINE 1/10 % EPIDURAL INFUSION (WH - ANES)
14.0000 mL/h | INTRAMUSCULAR | Status: DC
  Administered 2011-06-06: 14 mL/h via EPIDURAL
  Filled 2011-06-06 (×3): qty 60

## 2011-06-06 MED ORDER — LACTATED RINGERS IV SOLN: 500.0000 mL | Freq: Once | INTRAVENOUS | Status: DC

## 2011-06-06 MED ORDER — EPHEDRINE 5 MG/ML INJ
10.0000 mg | INTRAVENOUS | Status: AC | PRN
  Administered 2011-06-06 (×2): 10 mg via INTRAVENOUS
  Filled 2011-06-06 (×2): qty 4

## 2011-06-06 MED ORDER — CITRIC ACID-SODIUM CITRATE 334-500 MG/5ML PO SOLN
30.0000 mL | ORAL | Status: DC | PRN
  Filled 2011-06-06: qty 15

## 2011-06-06 MED ORDER — LACTATED RINGERS IV SOLN
INTRAVENOUS | Status: DC
  Administered 2011-06-06: 17:00:00 via INTRAVENOUS

## 2011-06-06 MED ORDER — DIPHENHYDRAMINE HCL 50 MG/ML IJ SOLN: 12.5000 mg | INTRAMUSCULAR | Status: DC | PRN

## 2011-06-06 MED ORDER — PHENYLEPHRINE 40 MCG/ML (10ML) SYRINGE FOR IV PUSH (FOR BLOOD PRESSURE SUPPORT)
80.0000 ug | PREFILLED_SYRINGE | INTRAVENOUS | Status: DC | PRN
  Filled 2011-06-06: qty 5

## 2011-06-06 MED ORDER — OXYTOCIN 20 UNITS IN LACTATED RINGERS INFUSION - SIMPLE: 125.0000 mL/h | Freq: Once | INTRAVENOUS | Status: DC

## 2011-06-06 MED ORDER — ONDANSETRON HCL 4 MG/2ML IJ SOLN
4.0000 mg | Freq: Four times a day (QID) | INTRAMUSCULAR | Status: DC | PRN
  Administered 2011-06-06: 4 mg via INTRAVENOUS
  Filled 2011-06-06: qty 2

## 2011-06-06 NOTE — MAU Note (Signed)
Patient states she is having contractions every 3-5 minutes. Had leaking of clear fluid that has wet her pants. Will be triaged in [facility].

## 2011-06-06 NOTE — Discharge Instructions (Signed)

## 2011-06-06 NOTE — Anesthesia Procedure Notes (Signed)

## 2011-06-06 NOTE — H&P (Signed)
41 y.o. [redacted]w[redacted]d  G3P2002 comes in c/o labor and SROM at about 4pm.  Otherwise has good fetal movement and no bleeding.  Past Medical History  Diagnosis Date  . No pertinent past medical history   . Lumbar herniated disc 2011    L5  . Depression     Past Surgical History  Procedure Date  . Cholecystectomy   . Knee surgery     partial meniscectomy right knee    OB History    Grav Para Term Preterm Abortions TAB SAB Ect Mult Living   3 2 2  0 0 0 0 0 0 2     # Outc Date GA Lbr Len/2nd Wgt Sex Del Anes PTL Lv   1 TRM            2 TRM            3 CUR               History   Social History  . Marital Status: Single    Spouse Name: N/A    Number of Children: N/A  . Years of Education: N/A   Occupational History  . Not on file.   Social History Main Topics  . Smoking status: Current Everyday Smoker -- 0.2 packs/day    Types: Cigarettes  . Smokeless tobacco: Never Used  . Alcohol Use: No     socially  . Drug Use: No  . Sexually Active: Yes    Birth Control/ Protection: None   Other Topics Concern  . Not on file   Social History Narrative  . No narrative on file   Aspirin and Tramadol   Prenatal Course:  Bulging lumbar disc- patient had weaned off neurontin and vicodin.  Pt was a smoker and quit.  AMA-quad screen negative    Lungs/Cor:  NAD Abdomen:  soft, gravid Ex:  no cords, erythema SVE:  1-2/80/-1 clear per nurse. FHTs:  120s, good STV, NST R Toco:  q3-4   A/P   SROM/term labor.   GBS neg  Eleora Sutherland A

## 2011-06-06 NOTE — MAU Note (Signed)
C/o ucs since 0300 this AM; 

## 2011-06-06 NOTE — Anesthesia Preprocedure Evaluation (Addendum)

## 2011-06-06 NOTE — MAU Note (Signed)
Pt and her husband upset because the pt is being discharged home; pt and her husband verbally abusive and loud; SVE was 1/50%/ -3 which is about the same as her last SVE 2 days ago;c/o contracting since 0300;

## 2011-06-06 NOTE — Progress Notes (Signed)
Decel, patient to Left side and O2 per face mask.

## 2011-06-06 NOTE — Progress Notes (Signed)
Multiple variables, patient moved from right to left side, IV fluid bolus started, ultrasound adjusted, ephedrine administered for low BP.

## 2011-06-06 NOTE — MAU Note (Signed)
Pt has an appointment with her OB tomorrow;

## 2011-06-06 NOTE — MAU Note (Signed)
Pt is angry and left-- before discharge vitals or discharge instructions could be given to her; pt and her husband very frustrated and verbally loud;

## 2011-06-07 ENCOUNTER — Encounter (HOSPITAL_COMMUNITY): Payer: Self-pay | Admitting: *Deleted

## 2011-06-07 ENCOUNTER — Encounter (HOSPITAL_COMMUNITY): Payer: Self-pay | Admitting: Anesthesiology

## 2011-06-07 ENCOUNTER — Encounter (HOSPITAL_COMMUNITY)
Admission: AD | Disposition: A | Discharge status: Home / Self Care | Payer: Self-pay | Source: Ambulatory Visit | Attending: Obstetrics and Gynecology

## 2011-06-07 ENCOUNTER — Inpatient Hospital Stay (HOSPITAL_COMMUNITY): Payer: Medicaid Other | Admitting: Anesthesiology

## 2011-06-07 LAB — CBC
Hemoglobin: 8.2 g/dL — ABNORMAL LOW (ref 12.0–15.0)
MCHC: 33.1 g/dL (ref 30.0–36.0)
Platelets: 176 10*3/uL (ref 150–400)
RBC: 2.88 MIL/uL — ABNORMAL LOW (ref 3.87–5.11)

## 2011-06-07 LAB — RPR: RPR Ser Ql: NONREACTIVE

## 2011-06-07 SURGERY — Surgical Case
Anesthesia: Regional

## 2011-06-07 MED ORDER — TETANUS-DIPHTH-ACELL PERTUSSIS 5-2.5-18.5 LF-MCG/0.5 IM SUSP
0.5000 mL | Freq: Once | INTRAMUSCULAR | Status: AC
  Administered 2011-06-08: 0.5 mL via INTRAMUSCULAR
  Filled 2011-06-07: qty 0.5

## 2011-06-07 MED ORDER — DIBUCAINE 1 % RE OINT: 1.0000 "application " | TOPICAL_OINTMENT | RECTAL | Status: DC | PRN

## 2011-06-07 MED ORDER — IBUPROFEN 800 MG PO TABS: 800.0000 mg | ORAL_TABLET | Freq: Three times a day (TID) | ORAL | Status: DC

## 2011-06-07 MED ORDER — BISACODYL 10 MG RE SUPP: 10.0000 mg | Freq: Every day | RECTAL | Status: DC | PRN

## 2011-06-07 MED ORDER — ONDANSETRON HCL 4 MG/2ML IJ SOLN: 4.0000 mg | INTRAMUSCULAR | Status: DC | PRN

## 2011-06-07 MED ORDER — SIMETHICONE 80 MG PO CHEW
80.0000 mg | CHEWABLE_TABLET | ORAL | Status: DC | PRN
  Administered 2011-06-08 (×2): 80 mg via ORAL

## 2011-06-07 MED ORDER — SCOPOLAMINE 1 MG/3DAYS TD PT72
1.0000 | MEDICATED_PATCH | Freq: Once | TRANSDERMAL | Status: AC
  Administered 2011-06-07: 1.5 mg via TRANSDERMAL

## 2011-06-07 MED ORDER — BUTORPHANOL TARTRATE 2 MG/ML IJ SOLN: 2.0000 mg | Freq: Once | INTRAMUSCULAR | Status: DC

## 2011-06-07 MED ORDER — ZOLPIDEM TARTRATE 5 MG PO TABS
5.0000 mg | ORAL_TABLET | Freq: Every evening | ORAL | Status: DC | PRN
  Administered 2011-06-09: 5 mg via ORAL
  Filled 2011-06-07: qty 1

## 2011-06-07 MED ORDER — LANOLIN HYDROUS EX OINT: 1.0000 "application " | TOPICAL_OINTMENT | CUTANEOUS | Status: DC | PRN

## 2011-06-07 MED ORDER — LACTATED RINGERS IV SOLN
INTRAVENOUS | Status: DC
  Administered 2011-06-07: 07:00:00 via INTRAVENOUS

## 2011-06-07 MED ORDER — OXYCODONE-ACETAMINOPHEN 5-325 MG PO TABS
1.0000 | ORAL_TABLET | ORAL | Status: DC | PRN
  Administered 2011-06-07 (×4): 2 via ORAL
  Administered 2011-06-07: 1 via ORAL
  Administered 2011-06-08 – 2011-06-09 (×7): 2 via ORAL
  Administered 2011-06-09: 1 via ORAL
  Filled 2011-06-07 (×6): qty 2
  Filled 2011-06-07: qty 1
  Filled 2011-06-07 (×3): qty 2
  Filled 2011-06-07: qty 1
  Filled 2011-06-07 (×3): qty 2

## 2011-06-07 MED ORDER — FENTANYL CITRATE 0.05 MG/ML IJ SOLN
25.0000 ug | INTRAMUSCULAR | Status: DC | PRN
  Administered 2011-06-07 (×2): 50 ug via INTRAVENOUS

## 2011-06-07 MED ORDER — FERROUS SULFATE 325 (65 FE) MG PO TABS
325.0000 mg | ORAL_TABLET | Freq: Two times a day (BID) | ORAL | Status: DC
  Administered 2011-06-08 – 2011-06-10 (×4): 325 mg via ORAL
  Filled 2011-06-07 (×5): qty 1

## 2011-06-07 MED ORDER — WITCH HAZEL-GLYCERIN EX PADS: 1.0000 "application " | MEDICATED_PAD | CUTANEOUS | Status: DC | PRN

## 2011-06-07 MED ORDER — OXYTOCIN 20 UNITS IN LACTATED RINGERS INFUSION - SIMPLE
125.0000 mL/h | INTRAVENOUS | Status: AC
Start: 2011-06-07 — End: 2011-06-07

## 2011-06-07 MED ORDER — PHENYLEPHRINE HCL 10 MG/ML IJ SOLN
INTRAMUSCULAR | Status: DC | PRN
  Administered 2011-06-07 (×3): 40 ug via INTRAVENOUS

## 2011-06-07 MED ORDER — SCOPOLAMINE 1 MG/3DAYS TD PT72
MEDICATED_PATCH | TRANSDERMAL | Status: AC
  Filled 2011-06-07: qty 1

## 2011-06-07 MED ORDER — SODIUM CHLORIDE 0.9 % IV SOLN
1.0000 ug/kg/h | INTRAVENOUS | Status: DC | PRN
  Filled 2011-06-07: qty 2.5

## 2011-06-07 MED ORDER — SIMETHICONE 80 MG PO CHEW
80.0000 mg | CHEWABLE_TABLET | Freq: Three times a day (TID) | ORAL | Status: DC
  Administered 2011-06-07 – 2011-06-10 (×9): 80 mg via ORAL

## 2011-06-07 MED ORDER — NALOXONE HCL 0.4 MG/ML IJ SOLN: 0.4000 mg | INTRAMUSCULAR | Status: DC | PRN

## 2011-06-07 MED ORDER — MENTHOL 3 MG MT LOZG: 1.0000 | LOZENGE | OROMUCOSAL | Status: DC | PRN

## 2011-06-07 MED ORDER — DIPHENHYDRAMINE HCL 25 MG PO CAPS: 25.0000 mg | ORAL_CAPSULE | Freq: Four times a day (QID) | ORAL | Status: DC | PRN

## 2011-06-07 MED ORDER — MEPERIDINE HCL 25 MG/ML IJ SOLN: 6.2500 mg | INTRAMUSCULAR | Status: DC | PRN

## 2011-06-07 MED ORDER — OXYTOCIN 10 UNIT/ML IJ SOLN
INTRAMUSCULAR | Status: AC
  Filled 2011-06-07: qty 4

## 2011-06-07 MED ORDER — BUTORPHANOL TARTRATE 2 MG/ML IJ SOLN
2.0000 mg | Freq: Once | INTRAMUSCULAR | Status: AC
  Administered 2011-06-07: 2 mg via INTRAVENOUS
  Filled 2011-06-07: qty 1

## 2011-06-07 MED ORDER — SODIUM CHLORIDE 0.9 % IJ SOLN: 3.0000 mL | INTRAMUSCULAR | Status: DC | PRN

## 2011-06-07 MED ORDER — FENTANYL CITRATE 0.05 MG/ML IJ SOLN
INTRAMUSCULAR | Status: AC
  Filled 2011-06-07: qty 2

## 2011-06-07 MED ORDER — DIPHENHYDRAMINE HCL 50 MG/ML IJ SOLN: 25.0000 mg | INTRAMUSCULAR | Status: DC | PRN

## 2011-06-07 MED ORDER — METOCLOPRAMIDE HCL 5 MG/ML IJ SOLN: 10.0000 mg | Freq: Three times a day (TID) | INTRAMUSCULAR | Status: DC | PRN

## 2011-06-07 MED ORDER — METHYLERGONOVINE MALEATE 0.2 MG/ML IJ SOLN: 0.2000 mg | INTRAMUSCULAR | Status: DC | PRN

## 2011-06-07 MED ORDER — SENNOSIDES-DOCUSATE SODIUM 8.6-50 MG PO TABS
2.0000 | ORAL_TABLET | Freq: Every day | ORAL | Status: DC
  Administered 2011-06-07 – 2011-06-09 (×3): 2 via ORAL

## 2011-06-07 MED ORDER — ONDANSETRON HCL 4 MG/2ML IJ SOLN
INTRAMUSCULAR | Status: DC | PRN
  Administered 2011-06-07: 4 mg via INTRAVENOUS

## 2011-06-07 MED ORDER — PRENATAL MULTIVITAMIN CH
1.0000 | ORAL_TABLET | Freq: Every day | ORAL | Status: DC
  Administered 2011-06-07 – 2011-06-10 (×3): 1 via ORAL
  Filled 2011-06-07 (×3): qty 1

## 2011-06-07 MED ORDER — CEFAZOLIN SODIUM 1-5 GM-% IV SOLN
INTRAVENOUS | Status: DC | PRN
  Administered 2011-06-07: 1 g via INTRAVENOUS

## 2011-06-07 MED ORDER — PROMETHAZINE HCL 25 MG/ML IJ SOLN: 6.2500 mg | INTRAMUSCULAR | Status: DC | PRN

## 2011-06-07 MED ORDER — ONDANSETRON HCL 4 MG/2ML IJ SOLN
INTRAMUSCULAR | Status: AC
  Filled 2011-06-07: qty 2

## 2011-06-07 MED ORDER — IBUPROFEN 600 MG PO TABS
600.0000 mg | ORAL_TABLET | Freq: Four times a day (QID) | ORAL | Status: DC
  Administered 2011-06-07 – 2011-06-10 (×13): 600 mg via ORAL
  Filled 2011-06-07 (×13): qty 1

## 2011-06-07 MED ORDER — METHYLERGONOVINE MALEATE 0.2 MG PO TABS: 0.2000 mg | ORAL_TABLET | ORAL | Status: DC | PRN

## 2011-06-07 MED ORDER — OXYTOCIN 20 UNITS IN LACTATED RINGERS INFUSION - SIMPLE
INTRAVENOUS | Status: DC | PRN
  Administered 2011-06-07 (×2): 20 [IU] via INTRAVENOUS

## 2011-06-07 MED ORDER — FLEET ENEMA 7-19 GM/118ML RE ENEM: 1.0000 | ENEMA | Freq: Every day | RECTAL | Status: DC | PRN

## 2011-06-07 MED ORDER — DIPHENHYDRAMINE HCL 50 MG/ML IJ SOLN: 12.5000 mg | INTRAMUSCULAR | Status: DC | PRN

## 2011-06-07 MED ORDER — ONDANSETRON HCL 4 MG/2ML IJ SOLN: 4.0000 mg | Freq: Three times a day (TID) | INTRAMUSCULAR | Status: DC | PRN

## 2011-06-07 MED ORDER — ONDANSETRON HCL 4 MG PO TABS: 4.0000 mg | ORAL_TABLET | ORAL | Status: DC | PRN

## 2011-06-07 MED ORDER — PHENYLEPHRINE 40 MCG/ML (10ML) SYRINGE FOR IV PUSH (FOR BLOOD PRESSURE SUPPORT)
PREFILLED_SYRINGE | INTRAVENOUS | Status: AC
  Filled 2011-06-07: qty 5

## 2011-06-07 MED ORDER — DIPHENHYDRAMINE HCL 25 MG PO CAPS: 25.0000 mg | ORAL_CAPSULE | ORAL | Status: DC | PRN

## 2011-06-07 MED ORDER — MORPHINE SULFATE 0.5 MG/ML IJ SOLN
INTRAMUSCULAR | Status: AC
  Filled 2011-06-07: qty 10

## 2011-06-07 MED ORDER — LACTATED RINGERS IV SOLN
INTRAVENOUS | Status: DC | PRN
  Administered 2011-06-07: via INTRAVENOUS

## 2011-06-07 MED ORDER — MEASLES, MUMPS & RUBELLA VAC ~~LOC~~ INJ
0.5000 mL | INJECTION | Freq: Once | SUBCUTANEOUS | Status: DC
  Filled 2011-06-07: qty 0.5

## 2011-06-07 MED ORDER — MORPHINE SULFATE (PF) 0.5 MG/ML IJ SOLN
INTRAMUSCULAR | Status: DC | PRN
  Administered 2011-06-07: 1 mg via INTRAVENOUS
  Administered 2011-06-07: 4 mg via EPIDURAL

## 2011-06-07 MED ORDER — FENTANYL CITRATE 0.05 MG/ML IJ SOLN
INTRAMUSCULAR | Status: DC | PRN
  Administered 2011-06-07 (×2): 100 ug via INTRAVENOUS

## 2011-06-07 SURGICAL SUPPLY — 27 items
CHLORAPREP W/TINT 26ML (MISCELLANEOUS) ×2 IMPLANT
CLOTH BEACON ORANGE TIMEOUT ST (SAFETY) ×2 IMPLANT
ELECT REM PT RETURN 9FT ADLT (ELECTROSURGICAL) ×2
ELECTRODE REM PT RTRN 9FT ADLT (ELECTROSURGICAL) ×1 IMPLANT
EXTRACTOR VACUUM BELL STYLE (SUCTIONS) IMPLANT
GLOVE BIO SURGEON STRL SZ7 (GLOVE) ×4 IMPLANT
GOWN PREVENTION PLUS LG XLONG (DISPOSABLE) ×6 IMPLANT
KIT ABG SYR 3ML LUER SLIP (SYRINGE) IMPLANT
NDL HYPO 25X5/8 SAFETYGLIDE (NEEDLE) IMPLANT
NEEDLE HYPO 25X5/8 SAFETYGLIDE (NEEDLE) IMPLANT
NS IRRIG 1000ML POUR BTL (IV SOLUTION) ×2 IMPLANT
PACK C SECTION WH (CUSTOM PROCEDURE TRAY) ×2 IMPLANT
RETRACTOR WND ALEXIS 25 LRG (MISCELLANEOUS) ×1 IMPLANT
RTRCTR WOUND ALEXIS 25CM LRG (MISCELLANEOUS) ×2
SLEEVE SCD COMPRESS KNEE MED (MISCELLANEOUS) IMPLANT
STAPLER VISISTAT 35W (STAPLE) IMPLANT
SUT MNCRL 0 VIOLET CTX 36 (SUTURE) ×2 IMPLANT
SUT MONOCRYL 0 CTX 36 (SUTURE) ×2
SUT PDS AB 0 CTX 60 (SUTURE) IMPLANT
SUT PLAIN 2 0 XLH (SUTURE) IMPLANT
SUT VIC AB 0 CT1 27 (SUTURE) ×4
SUT VIC AB 0 CT1 27XBRD ANBCTR (SUTURE) ×2 IMPLANT
SUT VIC AB 2-0 CT1 27 (SUTURE) ×2
SUT VIC AB 2-0 CT1 TAPERPNT 27 (SUTURE) ×1 IMPLANT
TOWEL OR 17X24 6PK STRL BLUE (TOWEL DISPOSABLE) ×4 IMPLANT
TRAY FOLEY CATH 14FR (SET/KITS/TRAYS/PACK) ×2 IMPLANT
WATER STERILE IRR 1000ML POUR (IV SOLUTION) ×2 IMPLANT

## 2011-06-07 NOTE — Progress Notes (Signed)
Dr. Henderson Cloud pg'd and notified of pt. Fundas 4 above , small amount of bleeding and no kinks in foley catheter. Orders received

## 2011-06-07 NOTE — Plan of Care (Signed)
Problem: Phase I Progression Outcomes Goal: Voiding adequately Outcome: Not Met (add Reason) HAS FOLEY Goal: Initial discharge plan identified Outcome: Completed/Met Date Met:  06/07/11 3 NIGHTS  Comments:  PT. HAS FOLEY

## 2011-06-07 NOTE — Transfer of Care (Signed)
Immediate Anesthesia Transfer of Care Note  Patient: Alexa Sims  Procedure(s) Performed: Procedure(s) (LRB): CESAREAN SECTION (N/A)  Patient Location: PACU  Anesthesia Type: Epidural  Level of Consciousness: awake, alert  and oriented  Airway & Oxygen Therapy: Patient Spontanous Breathing  Post-op Assessment: Report given to PACU RN and Post -op Vital signs reviewed and stable  Post vital signs: Reviewed and stable  Complications: No apparent anesthesia complications

## 2011-06-07 NOTE — Brief Op Note (Signed)
06/06/2011 - 06/07/2011  1:36 AM  PATIENT:  Alexa Sims  41 y.o. female  PRE-OPERATIVE DIAGNOSIS:  Non reassuring FHTs  POST-OPERATIVE DIAGNOSIS:  same  PROCEDURE:  Procedure(s) (LRB): CESAREAN SECTION (N/A)  SURGEON:  Surgeon(s) and Role:    * Loney Laurence, MD - Primary   ANESTHESIA:   epidural  EBL:  Total I/O In: 1400 [I.V.:1400] Out: 800 [Urine:300; Blood:500]  SPECIMEN:  Source of Specimen:  placenta  DISPOSITION OF SPECIMEN:  PATHOLOGY  COUNTS:  YES  PLAN OF CARE: Admit to inpatient   PATIENT DISPOSITION:  PACU - hemodynamically stable.   Delay start of Pharmacological VTE agent (>24hrs) due to surgical blood loss or risk of bleeding: not applicable  Complications:  none Medications:  Ancef after incision, Pitocin Findings:  Baby girl, Apgars 4,6,8, weight P.   Cord gas was 6.98.  Normal tubes, ovaries and uterus seen.  Reason for delivery:  At 22:30, I was at the bedside checking the patient because of several severe variables but normal baseline.  The pt was 9 and she was started on O2.  At 22:50 when I went to do another C/S, the [facility] were 150s with good STV and only mild variables.  At around midnight, I asked the nurse while I was closing the other pt's incision to pull up the strip for Ms. Krotzer.  I immediately noted sever repetitive variable decels with now tachycardia.  The short term variability was good but I asked the nurse to call a urgent section and asked the OR staff to open a second room.  I was able to scrub for Ms Lenhardt's section as they were moving her to the OR table.  Dr. Lilli Light had been dosing her and I was able to begin the C/S within 10 minutes of calling the section; the baby was delivered about 5 minutes later.  Technique:  Adequate epidural anesthesia was achieved, the patient was prepped and draped in usual sterile fashion.  A foley catheter was used to drain the bladder.  A pfannanstiel incision was made with the scalpel and carried  down to the fascia with the bovie cautery. The fascia was incised in the midline with the scalpel and carried in a transverse curvilinear manner bilaterally.  The fascia was reflected superiorly and inferiorly off the rectus muscles and the muscles split in the midline.  A bowel free portion of the peritoneum was entered bluntly and then extended in a superior and inferior manner with good visualization of the bowel and bladder.  The Alexis instrument was then placed and the vesico-uterine fascia tented up and incised in a transverse curvilinear manner.  A 2 cm transverse incision was made in the upper portion of the lower uterine segment until the amnion was exposed.   The incision was extended transversely in a blunt manner.  Thick mec fluid was noted (the fluid had previously been clear) and a nuchal cord was reduced after the baby delivered in the vertex presentation without complication.  The baby was bulb suctioned and the cord was clamped and cut.  The baby was then handed to awaiting Neonatology.  The placenta was then delivered manually and the uterus cleared of all debris.  The uterine incision was then closed with a running lock stitch of 0 monocryl.  An imbricating layer of 0 monocryl was closed as well. Good hemostasis of the uterine incision was achieved and the abdomen was cleared with irrigation.  The peritoneum was closed with a running stitch  of 2-0 vicryl.  This incorporated the rectus muscles as a separate layer.  The fascia was then closed with a running stitch of 0 vicryl.  The subcutaneous layer was closed with interrupted  stitches of 2-0 plain gut.  The skin was closed with staples.  The patient tolerated the procedure well and was returned to the recovery room in stable condition.  All counts were correct times three.  The baby went to the NICU; initial intubation showed no mec below the cords but the baby was having retraction with respirations and was eventually intubated in the NICU.   The baby had responded well to stimulation.  Emeline Simpson A

## 2011-06-07 NOTE — Addendum Note (Signed)
Addendum  created 06/07/11 1610 by Jhonnie Garner, CRNA   Modules edited:Notes Section

## 2011-06-07 NOTE — Progress Notes (Signed)
At 2246 Dr. Henderson Cloud examined patient and determined that patient was still 9 CM dilated. She will complete C/S on another patient and if Ermalinda Memos is not complete at that time she will section her.

## 2011-06-07 NOTE — Anesthesia Postprocedure Evaluation (Signed)
Anesthesia Post Note  Patient: Alexa Sims  Procedure(s) Performed: Procedure(s) (LRB): CESAREAN SECTION (N/A)  Anesthesia type: Epidural  Patient location: Mother/Baby  Post pain: Pain level controlled  Post assessment: Post-op Vital signs reviewed  Last Vitals:  Filed Vitals:   06/07/11 0641  BP: 95/56  Pulse: 81  Temp: 36.9 C  Resp: 20    Post vital signs: Reviewed  Level of consciousness: awake  Complications: No apparent anesthesia complications

## 2011-06-07 NOTE — Anesthesia Postprocedure Evaluation (Signed)
Anesthesia Post Note  Patient: Alexa Sims  Procedure(s) Performed: Procedure(s) (LRB): CESAREAN SECTION (N/A)  Anesthesia type: Epidural  Patient location: PACU  Post pain: Pain level controlled  Post assessment: Post-op Vital signs reviewed  Last Vitals:  Filed Vitals:   06/07/11 0145  BP:   Pulse: 115  Temp:   Resp: 25    Post vital signs: Reviewed  Level of consciousness: awake  Complications: No apparent anesthesia complications

## 2011-06-07 NOTE — Progress Notes (Signed)
BP while patient changing positions, retake.

## 2011-06-07 NOTE — Progress Notes (Signed)
UR Chart review completed.  

## 2011-06-07 NOTE — Progress Notes (Signed)
Pt. Pain increased again. To 10 scale. Pt. Acting like labor contractions. Bleeding normal - small amount Rubra. Fundal height 2 above now. Foley patent,non kinked. Dr. Henderson Cloud pg'd and notified of pain, fundal height and contraction  like pain . Orders received for percocet and 800 mg of Motrin now. Plan to follow up with Dr. Dareen Piano coming on call at 0800 if needed.

## 2011-06-07 NOTE — Op Note (Signed)
06/06/2011 - 06/07/2011  1:36 AM  PATIENT:  Alexa Sims  41 y.o. female  PRE-OPERATIVE DIAGNOSIS:  Non reassuring FHTs  POST-OPERATIVE DIAGNOSIS:  same  PROCEDURE:  Procedure(s) (LRB): CESAREAN SECTION (N/A)  SURGEON:  Surgeon(s) and Role:    * Kristofer Schaffert A Daryl Beehler, MD - Primary   ANESTHESIA:   epidural  EBL:  Total I/O In: 1400 [I.V.:1400] Out: 800 [Urine:300; Blood:500]  SPECIMEN:  Source of Specimen:  placenta  DISPOSITION OF SPECIMEN:  PATHOLOGY  COUNTS:  YES  PLAN OF CARE: Admit to inpatient   PATIENT DISPOSITION:  PACU - hemodynamically stable.   Delay start of Pharmacological VTE agent (>24hrs) due to surgical blood loss or risk of bleeding: not applicable  Complications:  none Medications:  Ancef after incision, Pitocin Findings:  Baby girl, Apgars 4,6,8, weight P.   Cord gas was 6.98.  Normal tubes, ovaries and uterus seen.  Reason for delivery:  At 22:30, I was at the bedside checking the patient because of several severe variables but normal baseline.  The pt was 9 and she was started on O2.  At 22:50 when I went to do another C/S, the FHTs were 150s with good STV and only mild variables.  At around midnight, I asked the nurse while I was closing the other pt's incision to pull up the strip for Ms. Sauve.  I immediately noted sever repetitive variable decels with now tachycardia.  The short term variability was good but I asked the nurse to call a urgent section and asked the OR staff to open a second room.  I was able to scrub for Ms Kessner's section as they were moving her to the OR table.  Dr. Hatchet had been dosing her and I was able to begin the C/S within 10 minutes of calling the section; the baby was delivered about 5 minutes later.  Technique:  Adequate epidural anesthesia was achieved, the patient was prepped and draped in usual sterile fashion.  A foley catheter was used to drain the bladder.  A pfannanstiel incision was made with the scalpel and carried  down to the fascia with the bovie cautery. The fascia was incised in the midline with the scalpel and carried in a transverse curvilinear manner bilaterally.  The fascia was reflected superiorly and inferiorly off the rectus muscles and the muscles split in the midline.  A bowel free portion of the peritoneum was entered bluntly and then extended in a superior and inferior manner with good visualization of the bowel and bladder.  The Alexis instrument was then placed and the vesico-uterine fascia tented up and incised in a transverse curvilinear manner.  A 2 cm transverse incision was made in the upper portion of the lower uterine segment until the amnion was exposed.   The incision was extended transversely in a blunt manner.  Thick mec fluid was noted (the fluid had previously been clear) and a nuchal cord was reduced after the baby delivered in the vertex presentation without complication.  The baby was bulb suctioned and the cord was clamped and cut.  The baby was then handed to awaiting Neonatology.  The placenta was then delivered manually and the uterus cleared of all debris.  The uterine incision was then closed with a running lock stitch of 0 monocryl.  An imbricating layer of 0 monocryl was closed as well. Good hemostasis of the uterine incision was achieved and the abdomen was cleared with irrigation.  The peritoneum was closed with a running stitch   of 2-0 vicryl.  This incorporated the rectus muscles as a separate layer.  The fascia was then closed with a running stitch of 0 vicryl.  The subcutaneous layer was closed with interrupted  stitches of 2-0 plain gut.  The skin was closed with staples.  The patient tolerated the procedure well and was returned to the recovery room in stable condition.  All counts were correct times three.  The baby went to the NICU; initial intubation showed no mec below the cords but the baby was having retraction with respirations and was eventually intubated in the NICU.   The baby had responded well to stimulation.  Bari Handshoe A    

## 2011-06-08 NOTE — Progress Notes (Signed)
POD#1 Pt without c/o. Pt is hypotensive but not symptomatic or tachycardic.  Lochia- wnl HGB-8.2 IMP/ stable Plan/ routine care.

## 2011-06-09 MED ORDER — OXYCODONE-ACETAMINOPHEN 5-325 MG PO TABS
1.0000 | ORAL_TABLET | ORAL | Status: DC | PRN
  Administered 2011-06-09: 2 via ORAL
  Administered 2011-06-09 (×2): 1 via ORAL
  Administered 2011-06-10 (×3): 2 via ORAL
  Filled 2011-06-09: qty 2
  Filled 2011-06-09 (×2): qty 1
  Filled 2011-06-09 (×5): qty 2

## 2011-06-09 NOTE — Progress Notes (Signed)
POD#2 Pt states that she is doing well. Baby is doing well in NICU. VSSAF IMP/ stable Plan/ routine care.

## 2011-06-09 NOTE — Clinical Social Work Maternal (Signed)
Clinical Social Work Department PSYCHOSOCIAL ASSESSMENT - MATERNAL/CHILD 06/09/2011  Patient:  Alexa Sims,Alexa Sims  Account Number:  [id number]  Admit Date:  06/06/2011  Childs Name:   Lillian Blu Holden    Clinical Social Worker:  Berline Semrad, LCSW   Date/Time:  06/09/2011 12:00 N  Date Referred:  06/08/2011   Referral source  NICU     Referred reason  NICU   Other referral source:    I:  FAMILY / HOME ENVIRONMENT Child's legal guardian:  PARENT  Guardian - Name Guardian - Age Guardian - Address  Clayton Piotrowski [address], [place], [place] [address]   Other household support members/support persons Name Relationship DOB  Robby Holden FATHER   Abigail Henken SISTER 2 years old  Danika Pierce SISTER 5 years old   Other support:    II  PSYCHOSOCIAL DATA Information Source:  Patient Interview  Financial and Community Resources Employment:   [employer]   Financial resources:  Medicaid If Medicaid - County:  [place]  School / Grade:   Maternity Care Coordinator / Child Services Coordination / Early Interventions:   N/A  Cultural issues impacting care:   None per pt.    III  STRENGTHS Strengths  Adequate Resources  Home prepared for Child (including basic supplies)  Supportive family/friends   Strength comment:  Pt was able to express her feelings and needs openly.  She stated that the FOB is very supportive and attentive.   IV  RISK FACTORS AND CURRENT PROBLEMS Current Problem:       V  SOCIAL WORK ASSESSMENT SW referral due to pt's baby in the NICU.  Baby was full term but had medical complications.  MOB stated she was understanding of baby's condition and that staff has provided adequate education.    Pt has Medicaid and states she does not require WIC or food stamps due to being financially stable.  She has continued health care for the children and follow up is not a concern.  She also has supplies and support.  NICU SW to continue following to  assess needs and to provide spport.      VI SOCIAL WORK PLAN Social Work Plan  Psychosocial Support/Ongoing Assessment of Needs   Type of pt/family education:   SW provided NICU/SW brochure.   If child protective services report - county:   If child protective services report - date:   Information/referral to community resources comment:   Other social work plan:     

## 2011-06-10 ENCOUNTER — Encounter (HOSPITAL_COMMUNITY): Payer: Self-pay | Admitting: Obstetrics and Gynecology

## 2011-06-10 MED ORDER — IBUPROFEN 600 MG PO TABS: 600.0000 mg | ORAL_TABLET | Freq: Four times a day (QID) | ORAL | Status: AC

## 2011-06-10 MED ORDER — OXYCODONE-ACETAMINOPHEN 5-325 MG PO TABS: 1.0000 | ORAL_TABLET | ORAL | Status: AC | PRN

## 2011-06-10 NOTE — Discharge Summary (Signed)
Obstetric Discharge Summary Reason for Admission: spontaneous abortion Prenatal Procedures: none Intrapartum Procedures: cesarean: low cervical, transverse - NRFHT Postpartum Procedures: none Complications-Operative and Postpartum: none Hemoglobin  Date Value Range Status  06/07/2011 8.2* 12.0-15.0 (g/dL) Final     DELTA CHECK NOTED     REPEATED TO VERIFY     HCT  Date Value Range Status  06/07/2011 24.8* 36.0-46.0 (%) Final    Physical Exam:  General: alert and cooperative Lochia: appropriate Uterine Fundus: firm Incision: healing well, no significant drainage, no dehiscence, no significant erythema DVT Evaluation: No evidence of DVT seen on physical exam.  Discharge Diagnoses: Term Pregnancy-delivered  Discharge Information: Date: 06/10/2011 Activity: pelvic rest Diet: routine Medications: PNV, Ibuprofen, Colace, Iron and Percocet Condition: stable Instructions: refer to practice specific booklet Discharge to: home Follow-up Information    Follow up with HORVATH,MICHELLE A, MD in 4 weeks.   Contact information:   [address] [place] [address] [phone]          Newborn Data: Live born female  Birth Weight: 6 lb 12.5 oz (3075 g) APGAR: 4, 6  Baby remaining in NICU at time of mother's discharge.  Alexa Sims 06/10/2011, 9:26 AM

## 2011-06-10 NOTE — Progress Notes (Signed)
UR chart review completed.  

## 2011-06-27 ENCOUNTER — Inpatient Hospital Stay (HOSPITAL_COMMUNITY): Payer: Medicaid Other

## 2011-06-27 ENCOUNTER — Encounter (HOSPITAL_COMMUNITY): Payer: Self-pay

## 2011-06-27 ENCOUNTER — Inpatient Hospital Stay (HOSPITAL_COMMUNITY)
Admission: AD | Admit: 2011-06-27 | Discharge: 2011-06-27 | Disposition: A | Discharge status: Home / Self Care | Payer: Medicaid Other | Source: Ambulatory Visit | Attending: Obstetrics and Gynecology | Admitting: Obstetrics and Gynecology

## 2011-06-27 DIAGNOSIS — R209 Unspecified disturbances of skin sensation: Secondary | ICD-10-CM

## 2011-06-27 DIAGNOSIS — O909 Complication of the puerperium, unspecified: Secondary | ICD-10-CM | POA: Insufficient documentation

## 2011-06-27 DIAGNOSIS — L7682 Other postprocedural complications of skin and subcutaneous tissue: Secondary | ICD-10-CM

## 2011-06-27 MED ORDER — HYDROMORPHONE HCL PF 1 MG/ML IJ SOLN
1.0000 mg | Freq: Once | INTRAMUSCULAR | Status: AC
  Administered 2011-06-27: 1 mg via INTRAVENOUS

## 2011-06-27 MED ORDER — OXYCODONE-ACETAMINOPHEN 5-325 MG PO TABS: 2.0000 | ORAL_TABLET | ORAL | Status: AC | PRN

## 2011-06-27 MED ORDER — ONDANSETRON HCL 4 MG/2ML IJ SOLN
4.0000 mg | Freq: Once | INTRAMUSCULAR | Status: AC
  Administered 2011-06-27: 4 mg via INTRAVENOUS
  Filled 2011-06-27: qty 2

## 2011-06-27 MED ORDER — LACTATED RINGERS IV SOLN
INTRAVENOUS | Status: DC
  Administered 2011-06-27: 14:00:00 via INTRAVENOUS

## 2011-06-27 MED ORDER — HYDROMORPHONE HCL PF 1 MG/ML IJ SOLN
0.5000 mg | Freq: Once | INTRAMUSCULAR | Status: DC
  Filled 2011-06-27: qty 1

## 2011-06-27 MED ORDER — HYDROMORPHONE HCL PF 1 MG/ML IJ SOLN: 1.0000 mg | Freq: Once | INTRAMUSCULAR | Status: DC

## 2011-06-27 NOTE — Discharge Instructions (Signed)
Wound Infection  A wound infection happens when a type of germ (bacteria) starts growing in the wound. In some cases, this can cause the wound to break open. If cared for properly, the infected wound will heal from the inside to the outside. Wound infections need treatment.  CAUSES  An infection is caused by bacteria growing in the wound.   SYMPTOMS    Increase in redness, swelling, or pain at the wound site.   Increase in drainage at the wound site.   Wound or bandage (dressing) starts to smell bad.   Fever.   Feeling tired or fatigued.   Pus draining from the wound.  TREATMENT   You caregiver will prescribe antibiotic medicine. The wound infection should improve within 24 to 48 hours. Any redness around the wound should stop spreading and the wound should be less painful.   HOME CARE INSTRUCTIONS    Only take over-the-counter or prescription medicines for pain, discomfort, or fever as directed by your caregiver.   Take your antibiotics as directed. Finish them even if you start to feel better.   Gently wash the area with mild soap and water 2 times a day, or as directed. Rinse off the soap. Pat the area dry with a clean towel. Do not rub the wound. This may cause bleeding.   Follow your caregiver's instructions for how often you need to change the dressing.   Apply ointment and a dressing to the wound as directed.   If the dressing sticks, moisten it with soapy water and gently remove it.   Change the bandage right away if it becomes wet, dirty, or develops a bad smell.   Take showers. Do not take tub baths, swim, or do anything that may soak the wound until it is healed.   Avoid exercises that make you sweat heavily.   Use anti-itch medicine as directed by your caregiver. The wound may itch when it is healing. Do not pick or scratch at the wound.   Follow up with your caregiver to get your wound rechecked as directed.  SEEK MEDICAL CARE IF:   You have an increase in swelling, pain, or redness  around the wound.   You have an increase in the amount of pus coming from the wound.   There is a bad smell coming from the wound.   More of the wound breaks open.   You have a fever.  MAKE SURE YOU:    Understand these instructions.   Will watch your condition.   Will get help right away if you are not doing well or get worse.  Document Released: 10/13/2002 Document Revised: 01/03/2011 Document Reviewed: 05/20/2010  ExitCare Patient Information 2012 [employer].

## 2011-06-27 NOTE — MAU Note (Signed)
Patient is here with c/o intense pain at the c-section site. No oozing on site, hardened area along the incision line, very tender to touch. Patient states that she has no relief from pain.

## 2011-06-27 NOTE — MAU Note (Signed)
Patient states she had a cesarean section on 5-10. Has had an infectiono f the incision and has taken a round of antibiotics. Continues to have leaking and pain with swelling and redness on the incision.

## 2011-06-27 NOTE — MAU Provider Note (Signed)
History     CSN: [id number]  Arrival date and time: 06/27/11 1141   First Provider Initiated Contact with Patient 06/27/11 1216       Chief Complaint  Patient presents with  . Wound Check   HPI  Alexa Sims 41 y.o. patient of Dr. Claiborne Billings presents today with abdominal incision pain following c-section 5-10.  States she was seen in the office last week where the incision was "cut open with a scalpel, but there was no pus in there."  Patient states she is unable to tolerate anything touching the incision. Rates pain at 8/10.   OB History    Grav Para Term Preterm Abortions TAB SAB Ect Mult Living   3 3 3  0 0 0 0 0 0 3      Past Medical History  Diagnosis Date  . No pertinent past medical history   . Lumbar herniated disc 2011    L5  . Depression     Past Surgical History  Procedure Date  . Cholecystectomy   . Knee surgery     partial meniscectomy right knee  . Cesarean section 06/07/2011    Procedure: CESAREAN SECTION;  Surgeon: Loney Laurence, MD;  Location: [facility];  Service: Gynecology;  Laterality: N/A;    Family History  Problem Relation Age of Onset  . Hyperlipidemia Father   . Hypertension Father   . Heart attack Father   . Rheum arthritis Sister   . Sudden death Paternal Uncle   . Hypertension Maternal Grandmother   . Diabetes Neg Hx   . Anesthesia problems Neg Hx   . Birth defects Brother     History  Substance Use Topics  . Smoking status: Current Everyday Smoker -- 0.2 packs/day    Types: Cigarettes  . Smokeless tobacco: Never Used  . Alcohol Use: No     socially    Allergies:  Allergies  Allergen Reactions  . Aspirin     Bothers stomach  . Tramadol Nausea And Vomiting and Other (See Comments)    Blurred vision.    Prescriptions prior to admission  Medication Sig Dispense Refill  . HYDROcodone-acetaminophen (NORCO) 5-325 MG per tablet Take 1 tablet by mouth every 6 (six) hours as needed. For pain       . Prenatal Vit-Fe  Fumarate-FA (PRENATAL MULTIVITAMIN) TABS Take 1 tablet by mouth every morning.        Review of Systems  Constitutional: Positive for fever, chills and diaphoresis.       States wakes up at night with "a lot of sweat".  HENT: Negative.   Eyes: Negative.   Respiratory: Negative.   Cardiovascular: Negative.   Gastrointestinal: Negative.  Negative for nausea, vomiting and diarrhea.  Genitourinary: Negative.   Musculoskeletal: Negative.   Skin: Negative.        Unable to tolerate anything touching abdominal incision.  Denies drainage or bleeding.  Neurological: Negative.   Endo/Heme/Allergies: Negative.   Psychiatric/Behavioral: Negative.    Physical Exam   Blood pressure 130/82, pulse 70, temperature 98.4 F (36.9 C), temperature source Oral, resp. rate 16, last menstrual period 08/25/2010, SpO2 99.00%, currently breastfeeding.  Physical Exam  Constitutional: She is oriented to person, place, and time. She appears well-developed and well-nourished.  HENT:  Head: Normocephalic and atraumatic.  Cardiovascular: Normal rate, regular rhythm, normal heart sounds and intact distal pulses.  Exam reveals no gallop and no friction rub.   No murmur heard. Respiratory: Effort normal and breath sounds normal. No  respiratory distress.  GI: Soft. Bowel sounds are normal. She exhibits no distension. There is tenderness. There is guarding.       Patient guards with minimal touch.  Unable to tolerate even moderate palpation. Several nonmobile hard areas felt under skin along incision.  Largest is approximately 1 inch, extending upwards from L upper edge of incision.  No erythema or warmth noted surrounding incision. No discharge, bleeding or odor noted.  Neurological: She is alert and oriented to person, place, and time.  Skin: Skin is warm and dry. She is not diaphoretic.       See abdominal assessment. Incision is raised and rough in appearance.  Psychiatric: Her behavior is normal.   Speculum  exam: Vagina - Moderate amount of blood noted pooling in vaginal vault, bleeding noted from cervical os. Cervix - No contact bleeding/ Bimanual exam: Cervix closed, long and thick. No cervical motion tenderness or discomfort noted with palpation of uterus.  Patient's discomfort is more external as she was afraid for me to touch her incision. Uterus non tender, normal size for postpartum status. Adnexa non tender, no masses bilaterally. Chaperone present for exam.  MAU Course: Discussed findings with Dr. Claiborne Billings and she request CBC, IV pain management and ultrasound.  Procedures  *RADIOLOGY REPORT*  Clinical Data: Pelvic pain. Post C-section on 06/07/2011 with  fever and pain at the incision site. The patient is actively  bleeding on today's exam.  TRANSABDOMINAL AND TRANSVAGINAL ULTRASOUND OF PELVIS  Technique: Both transabdominal and transvaginal ultrasound  examinations of the pelvis were performed. Transabdominal technique  was performed for global imaging of the pelvis including uterus,  ovaries, adnexal regions, and pelvic cul-de-sac.  Comparison: None.  It was necessary to proceed with endovaginal exam following the  transabdominal exam to visualize the endometrium and adnexa.  Findings:  Uterus: Demonstrates a sagittal length of 10.4 cm, depth of 6.6 cm  and width of 7.5 cm. Uterine size is compatible with the recent  postpartum status. No focal endometrial abnormality is seen with  the patient's recent C-section incision site apparent in the lower  uterine segment.  Endometrium: Appears thin with a single layer measurement of 3 mm.  A small amount of fluid and echogenic material is seen within the  endometrial canal. This does not demonstrate any color flow with  Doppler evaluation and is most compatible with a small amount of  intraluminal blood products.  Right ovary: Is not visualized with confidence either  transabdominally or endovaginally  Left ovary: Is not  identified with confidence either  transabdominally or endovaginally  Other findings: No pelvic fluid or separate adnexal masses are  seen.  IMPRESSION:  Small amount of intraluminal blood and fluid is seen within the  endometrial canal. No findings to suggest retained products of  conception with a normal postpartum uterine size and no adnexal  abnormalities identified.  Non-visualized ovaries.  Original Report Authenticated By: Bertha Stakes, M.D.    Assessment and Plan   Assessment:  Painful c-section abdominal incision.  Differential Diagnoses:  Infected incision Uteritis  Plan:  Follow up with Dr. Delray Alt office and return here as needed.   Servando Salina 06/27/2011, 12:19 PM   I have assisted the student in the exam, evaluation and plan of care.

## 2011-07-07 ENCOUNTER — Emergency Department (HOSPITAL_BASED_OUTPATIENT_CLINIC_OR_DEPARTMENT_OTHER)
Admission: EM | Admit: 2011-07-07 | Discharge: 2011-07-07 | Disposition: A | Discharge status: Home / Self Care | Payer: Medicaid Other | Attending: Emergency Medicine | Admitting: Emergency Medicine

## 2011-07-07 ENCOUNTER — Encounter (HOSPITAL_BASED_OUTPATIENT_CLINIC_OR_DEPARTMENT_OTHER): Payer: Self-pay | Admitting: *Deleted

## 2011-07-07 ENCOUNTER — Emergency Department (HOSPITAL_BASED_OUTPATIENT_CLINIC_OR_DEPARTMENT_OTHER): Payer: Medicaid Other

## 2011-07-07 DIAGNOSIS — W1809XA Striking against other object with subsequent fall, initial encounter: Secondary | ICD-10-CM | POA: Insufficient documentation

## 2011-07-07 DIAGNOSIS — IMO0002 Reserved for concepts with insufficient information to code with codable children: Secondary | ICD-10-CM | POA: Insufficient documentation

## 2011-07-07 DIAGNOSIS — N949 Unspecified condition associated with female genital organs and menstrual cycle: Secondary | ICD-10-CM | POA: Insufficient documentation

## 2011-07-07 DIAGNOSIS — F329 Major depressive disorder, single episode, unspecified: Secondary | ICD-10-CM | POA: Insufficient documentation

## 2011-07-07 DIAGNOSIS — S43409A Unspecified sprain of unspecified shoulder joint, initial encounter: Secondary | ICD-10-CM

## 2011-07-07 DIAGNOSIS — M25519 Pain in unspecified shoulder: Secondary | ICD-10-CM | POA: Insufficient documentation

## 2011-07-07 DIAGNOSIS — M542 Cervicalgia: Secondary | ICD-10-CM

## 2011-07-07 DIAGNOSIS — Y9301 Activity, walking, marching and hiking: Secondary | ICD-10-CM | POA: Insufficient documentation

## 2011-07-07 DIAGNOSIS — F17200 Nicotine dependence, unspecified, uncomplicated: Secondary | ICD-10-CM | POA: Insufficient documentation

## 2011-07-07 DIAGNOSIS — F3289 Other specified depressive episodes: Secondary | ICD-10-CM | POA: Insufficient documentation

## 2011-07-07 MED ORDER — HYDROCODONE-ACETAMINOPHEN 5-325 MG PO TABS: 2.0000 | ORAL_TABLET | ORAL | Status: AC | PRN

## 2011-07-07 MED ORDER — HYDROCODONE-ACETAMINOPHEN 5-325 MG PO TABS
2.0000 | ORAL_TABLET | Freq: Once | ORAL | Status: AC
  Administered 2011-07-07: 2 via ORAL
  Filled 2011-07-07: qty 2

## 2011-07-07 NOTE — Discharge Instructions (Signed)

## 2011-07-07 NOTE — ED Provider Notes (Signed)
History     CSN: [id number]  Arrival date & time 07/07/11  1334   First MD Initiated Contact with Patient 07/07/11 1404      Chief Complaint  Patient presents with  . Fall    (Consider location/radiation/quality/duration/timing/severity/associated sxs/prior treatment) Patient is a 41 y.o. female presenting with fall. The history is provided by the patient. No language interpreter was used.  Fall The accident occurred more than 2 days ago. The fall occurred while walking. She fell from a height of 3 to 5 ft. She landed on a hard floor. There was no blood loss. The point of impact was the right shoulder. The pain is present in the neck. The pain is at a severity of 6/10. The pain is moderate. She was not ambulatory at the scene. There was no entrapment after the fall. The symptoms are aggravated by activity and use of the injured limb. She has tried nothing for the symptoms. The treatment provided moderate relief.  Pt complains of pulling her shoulder when she fell on the stairs 2 days ago.   Pt complains of a shooting pain in her neck.   Pt hit and slid down 4 stairs.  Past Medical History  Diagnosis Date  . No pertinent past medical history   . Lumbar herniated disc 2011    L5  . Depression     Past Surgical History  Procedure Date  . Cholecystectomy   . Knee surgery     partial meniscectomy right knee  . Cesarean section 06/07/2011    Procedure: CESAREAN SECTION;  Surgeon: Loney Laurence, MD;  Location: [facility];  Service: Gynecology;  Laterality: N/A;    Family History  Problem Relation Age of Onset  . Hyperlipidemia Father   . Hypertension Father   . Heart attack Father   . Rheum arthritis Sister   . Sudden death Paternal Uncle   . Hypertension Maternal Grandmother   . Diabetes Neg Hx   . Anesthesia problems Neg Hx   . Birth defects Brother     History  Substance Use Topics  . Smoking status: Current Everyday Smoker -- 0.2 packs/day    Types: Cigarettes  .  Smokeless tobacco: Never Used  . Alcohol Use: Yes     socially    OB History    Grav Para Term Preterm Abortions TAB SAB Ect Mult Living   3 3 3  0 0 0 0 0 0 3      Review of Systems  Musculoskeletal: Positive for back pain, joint swelling and arthralgias.  All other systems reviewed and are negative.    Allergies  Aspirin and Tramadol  Home Medications   Current Outpatient Rx  Name Route Sig Dispense Refill  . IBUPROFEN 600 MG PO TABS Oral Take 600 mg by mouth every 6 (six) hours as needed. For pain    . OXYCODONE-ACETAMINOPHEN 5-325 MG PO TABS Oral Take 2 tablets by mouth every 4 (four) hours as needed for pain. 20 tablet 0  . PRENATAL MULTIVITAMIN CH Oral Take 1 tablet by mouth every morning.      BP 127/83  Pulse 58  Temp(Src) 97.7 F (36.5 C) (Oral)  Resp 20  Ht 5\' 3"  (1.6 m)  Wt 138 lb (62.596 kg)  BMI 24.45 kg/m2  SpO2 100%  LMP 07/07/2011  Breastfeeding? No  Physical Exam  Nursing note and vitals reviewed. Constitutional: She is oriented to person, place, and time. She appears well-developed and well-nourished.  HENT:  Head:  Normocephalic and atraumatic.  Right Ear: External ear normal.  Left Ear: External ear normal.  Eyes: Conjunctivae and EOM are normal. Pupils are equal, round, and reactive to light.  Neck: Normal range of motion. Neck supple.  Cardiovascular: Normal rate and normal heart sounds.   Pulmonary/Chest: Effort normal and breath sounds normal.  Abdominal: Soft.  Musculoskeletal: She exhibits tenderness.       Tender right shoulder,   Tender mid cervical spine,    Neurological: She is alert and oriented to person, place, and time.  Skin: Skin is warm.    ED Course  Procedures (including critical care time)  Labs Reviewed - No data to display No results found. Results for orders placed during the hospital encounter of 06/06/11  RPR      Component Value Range   RPR NON REACTIVE  NON REACTIVE   CBC      Component Value Range    WBC 11.6 (*) 4.0 - 10.5 (K/uL)   RBC 3.71 (*) 3.87 - 5.11 (MIL/uL)   Hemoglobin 10.7 (*) 12.0 - 15.0 (g/dL)   HCT 96.0 (*) 45.4 - 46.0 (%)   MCV 86.3  78.0 - 100.0 (fL)   MCH 28.8  26.0 - 34.0 (pg)   MCHC 33.4  30.0 - 36.0 (g/dL)   RDW 09.8  11.9 - 14.7 (%)   Platelets 228  150 - 400 (K/uL)  OB RESULTS CONSOLE GC/CHLAMYDIA      Component Value Range   Gonorrhea Negative     Chlamydia Negative    OB RESULTS CONSOLE RPR      Component Value Range   RPR Nonreactive    OB RESULTS CONSOLE HIV ANTIBODY (ROUTINE TESTING)      Component Value Range   HIV Non-reactive    OB RESULTS CONSOLE RUBELLA ANTIBODY, IGM      Component Value Range   Rubella Immune    OB RESULTS CONSOLE HEPATITIS B SURFACE ANTIGEN      Component Value Range   Hepatitis B Surface Ag Negative    OB RESULTS CONSOLE ABO/RH      Component Value Range   RH Type  Positive     ABO Grouping A    OB RESULTS CONSOLE ANTIBODY SCREEN      Component Value Range   Antibody Screen Negative    CBC      Component Value Range   WBC 14.0 (*) 4.0 - 10.5 (K/uL)   RBC 2.88 (*) 3.87 - 5.11 (MIL/uL)   Hemoglobin 8.2 (*) 12.0 - 15.0 (g/dL)   HCT 82.9 (*) 56.2 - 46.0 (%)   MCV 86.1  78.0 - 100.0 (fL)   MCH 28.5  26.0 - 34.0 (pg)   MCHC 33.1  30.0 - 36.0 (g/dL)   RDW 13.0  86.5 - 78.4 (%)   Platelets 176  150 - 400 (K/uL)   Dg Cervical Spine Complete  07/07/2011  *RADIOLOGY REPORT*  Clinical Data: Right neck and shoulder pain since falling down stairs yesterday.  CERVICAL SPINE - COMPLETE 4+ VIEW  Comparison: Cervical spine CT 03/04/2010.  Findings: The prevertebral soft tissues are normal.  The alignment is anatomic through T1.  There is no evidence of acute fracture or subluxation.  The C1-C2 articulation appears normal in the AP projection.  IMPRESSION: Stable examination.  No evidence of acute cervical spine fracture, subluxation or static signs of instability.  Original Report Authenticated By: Gerrianne Scale, M.D.   Dg  Shoulder Right  07/07/2011  *  RADIOLOGY REPORT*  Clinical Data: Fall, right shoulder pain  RIGHT SHOULDER - 2+ VIEW  Comparison: None.  Findings: Three views of the right shoulder submitted.  No acute fracture or subluxation.  No radiopaque foreign body.  IMPRESSION: No acute fracture or subluxation.  Original Report Authenticated By: Natasha Mead, M.D.   US Transvaginal Non-ob  06/27/2011  *RADIOLOGY REPORT*  Clinical Data: Pelvic pain.  Post C-section on 06/07/2011 with fever and pain at the incision site.  The patient is actively bleeding on today's exam.  TRANSABDOMINAL AND TRANSVAGINAL ULTRASOUND OF PELVIS Technique:  Both transabdominal and transvaginal ultrasound examinations of the pelvis were performed. Transabdominal technique was performed for global imaging of the pelvis including uterus, ovaries, adnexal regions, and pelvic cul-de-sac.  Comparison: None.   It was necessary to proceed with endovaginal exam following the transabdominal exam to visualize the endometrium and adnexa.  Findings:  Uterus: Demonstrates a sagittal length of 10.4 cm, depth of 6.6 cm and width of 7.5 cm.  Uterine size is compatible with the recent postpartum status.  No focal endometrial abnormality is seen with the patient's recent C-section incision site apparent in the lower uterine segment.  Endometrium: Appears thin with a single layer measurement of 3 mm. A small amount of fluid and echogenic material is seen within the endometrial canal.  This does not demonstrate any color flow with Doppler evaluation and is most compatible with a small amount of intraluminal blood products.  Right ovary:  Is not visualized with confidence either transabdominally or endovaginally  Left ovary: Is not identified with confidence either transabdominally or endovaginally  Other findings: No pelvic fluid or separate adnexal masses are seen.  IMPRESSION: Small amount of intraluminal blood and fluid is seen within the endometrial canal.  No  findings to suggest retained products of conception with a normal postpartum uterine size and no adnexal abnormalities identified.  Non-visualized ovaries.  Original Report Authenticated By: Bertha Stakes, M.D.   US Pelvis Complete  06/27/2011  *RADIOLOGY REPORT*  Clinical Data: Pelvic pain.  Post C-section on 06/07/2011 with fever and pain at the incision site.  The patient is actively bleeding on today's exam.  TRANSABDOMINAL AND TRANSVAGINAL ULTRASOUND OF PELVIS Technique:  Both transabdominal and transvaginal ultrasound examinations of the pelvis were performed. Transabdominal technique was performed for global imaging of the pelvis including uterus, ovaries, adnexal regions, and pelvic cul-de-sac.  Comparison: None.   It was necessary to proceed with endovaginal exam following the transabdominal exam to visualize the endometrium and adnexa.  Findings:  Uterus: Demonstrates a sagittal length of 10.4 cm, depth of 6.6 cm and width of 7.5 cm.  Uterine size is compatible with the recent postpartum status.  No focal endometrial abnormality is seen with the patient's recent C-section incision site apparent in the lower uterine segment.  Endometrium: Appears thin with a single layer measurement of 3 mm. A small amount of fluid and echogenic material is seen within the endometrial canal.  This does not demonstrate any color flow with Doppler evaluation and is most compatible with a small amount of intraluminal blood products.  Right ovary:  Is not visualized with confidence either transabdominally or endovaginally  Left ovary: Is not identified with confidence either transabdominally or endovaginally  Other findings: No pelvic fluid or separate adnexal masses are seen.  IMPRESSION: Small amount of intraluminal blood and fluid is seen within the endometrial canal.  No findings to suggest retained products of conception with a normal postpartum uterine size and  no adnexal abnormalities identified.  Non-visualized  ovaries.  Original Report Authenticated By: Bertha Stakes, M.D.     1. Neck pain   2. Shoulder sprain       MDM  Pt given hydrocodone.   I advised follow up with Dr. Ave Filter for recheck if pain persist past one week.        Alexa Sims, [place] 07/07/11 [phone]

## 2011-07-07 NOTE — ED Notes (Signed)
Pt states she was walking down the steps Friday night and slipped. Slid down 5 steps. Now c/o pain to right side neck, shoulder, arm and buttocks.

## 2011-07-10 NOTE — ED Provider Notes (Signed)
Medical screening examination/treatment/procedure(s) were performed by non-physician practitioner and as supervising physician I was immediately available for consultation/collaboration.  Anayia Eugene, MD 07/10/11 1806 

## 2011-08-20 ENCOUNTER — Encounter (HOSPITAL_COMMUNITY): Payer: Self-pay | Admitting: *Deleted

## 2011-08-20 ENCOUNTER — Emergency Department (HOSPITAL_COMMUNITY)
Admission: EM | Admit: 2011-08-20 | Discharge: 2011-08-21 | Disposition: A | Discharge status: Home / Self Care | Payer: Medicaid Other | Attending: Emergency Medicine | Admitting: Emergency Medicine

## 2011-08-20 DIAGNOSIS — Z9089 Acquired absence of other organs: Secondary | ICD-10-CM | POA: Insufficient documentation

## 2011-08-20 DIAGNOSIS — F329 Major depressive disorder, single episode, unspecified: Secondary | ICD-10-CM | POA: Insufficient documentation

## 2011-08-20 DIAGNOSIS — F3289 Other specified depressive episodes: Secondary | ICD-10-CM | POA: Insufficient documentation

## 2011-08-20 DIAGNOSIS — F17200 Nicotine dependence, unspecified, uncomplicated: Secondary | ICD-10-CM | POA: Insufficient documentation

## 2011-08-20 DIAGNOSIS — R109 Unspecified abdominal pain: Secondary | ICD-10-CM

## 2011-08-20 LAB — CBC WITH DIFFERENTIAL/PLATELET
Eosinophils Absolute: 0.2 10*3/uL (ref 0.0–0.7)
Eosinophils Relative: 4 % (ref 0–5)
Lymphocytes Relative: 41 % (ref 12–46)
MCHC: 32.2 g/dL (ref 30.0–36.0)
Monocytes Absolute: 0.4 10*3/uL (ref 0.1–1.0)
Neutro Abs: 2.3 10*3/uL (ref 1.7–7.7)
Neutrophils Relative %: 47 % (ref 43–77)
Platelets: 325 10*3/uL (ref 150–400)
RDW: 14.4 % (ref 11.5–15.5)
WBC: 5 10*3/uL (ref 4.0–10.5)

## 2011-08-20 MED ORDER — ONDANSETRON HCL 4 MG/2ML IJ SOLN
4.0000 mg | Freq: Once | INTRAMUSCULAR | Status: AC
  Administered 2011-08-20: 4 mg via INTRAVENOUS
  Filled 2011-08-20: qty 2

## 2011-08-20 MED ORDER — SODIUM CHLORIDE 0.9 % IV BOLUS (SEPSIS)
1000.0000 mL | Freq: Once | INTRAVENOUS | Status: AC
  Administered 2011-08-20: 1000 mL via INTRAVENOUS

## 2011-08-20 MED ORDER — MORPHINE SULFATE 4 MG/ML IJ SOLN
4.0000 mg | Freq: Once | INTRAMUSCULAR | Status: AC
  Administered 2011-08-20: 4 mg via INTRAVENOUS
  Filled 2011-08-20: qty 1

## 2011-08-20 NOTE — ED Provider Notes (Signed)
History     CSN: [id number]  Arrival date & time 08/20/11  2135   First MD Initiated Contact with Patient 08/20/11 2206      Chief Complaint  Patient presents with  . Abdominal Pain   HPI  History provided by the patient. Patient is a 41 year old female with history of cholecystectomy, recent cesarean section 2 months ago who presents with complaints of lower abdominal pain and cramping. Patient states that following her C-section surgery she had some intermittent cramping and sharp pains to lower abdomen. She was told to expect this. Symptoms seem to be improved over last several weeks but returned over the past 4-5 days. Pain has been severe at times causing patient to double over. Pain may last several minutes. She reports multiple episodes today unrelieved with any home medications. Symptoms have also been associated with some nausea. She denies any vomiting, diarrhea or constipation. She denies any dysuria, hematuria, urinary frequency or flank pain. Denies any vaginal bleeding or vaginal discharge.   Past Medical History  Diagnosis Date  . No pertinent past medical history   . Lumbar herniated disc 2011    L5  . Depression     Past Surgical History  Procedure Date  . Cholecystectomy   . Knee surgery     partial meniscectomy right knee  . Cesarean section 06/07/2011    Procedure: CESAREAN SECTION;  Surgeon: Loney Laurence, MD;  Location: [facility];  Service: Gynecology;  Laterality: N/A;    Family History  Problem Relation Age of Onset  . Hyperlipidemia Father   . Hypertension Father   . Heart attack Father   . Rheum arthritis Sister   . Sudden death Paternal Uncle   . Hypertension Maternal Grandmother   . Diabetes Neg Hx   . Anesthesia problems Neg Hx   . Birth defects Brother     History  Substance Use Topics  . Smoking status: Current Everyday Smoker -- 0.2 packs/day    Types: Cigarettes  . Smokeless tobacco: Never Used  . Alcohol Use: Yes     socially     OB History    Grav Para Term Preterm Abortions TAB SAB Ect Mult Living   3 3 3  0 0 0 0 0 0 3      Review of Systems  Constitutional: Negative for fever and chills.  Gastrointestinal: Positive for nausea and abdominal pain. Negative for vomiting, diarrhea and constipation.  Genitourinary: Negative for dysuria, frequency, hematuria, flank pain, vaginal bleeding and vaginal discharge.    Allergies  Aspirin and Tramadol  Home Medications   Current Outpatient Rx  Name Route Sig Dispense Refill  . IBUPROFEN 200 MG PO TABS Oral Take 400 mg by mouth every 8 (eight) hours as needed. For pain.    . ADULT MULTIVITAMIN W/MINERALS CH Oral Take 1 tablet by mouth daily.    Marland Kitchen PROBIOTIC PO Oral Take 1 capsule by mouth daily.      BP 108/61  Pulse 66  Temp 98.7 F (37.1 C)  Resp 20  SpO2 100%  LMP 08/13/2011  Breastfeeding? No  Physical Exam  Nursing note and vitals reviewed. Constitutional: She is oriented to person, place, and time. She appears well-developed and well-nourished. No distress.  HENT:  Head: Normocephalic.  Cardiovascular: Normal rate and regular rhythm.   Pulmonary/Chest: Effort normal and breath sounds normal. No respiratory distress. She has no wheezes.  Abdominal: Soft. She exhibits no distension. There is tenderness. There is no rebound, no guarding,  no CVA tenderness, no tenderness at McBurney's point and negative Murphy's sign.       Tenderness over well healed Pfannenstiel cesarean section scar. Increased tenderness to the right lower quadrant and pelvic area.  Neurological: She is alert and oriented to person, place, and time.  Skin: Skin is warm and dry. No rash noted. No erythema.  Psychiatric: She has a normal mood and affect. Her behavior is normal.    ED Course  Procedures    Results for orders placed during the hospital encounter of 08/20/11  CBC WITH DIFFERENTIAL      Component Value Range   WBC 5.0  4.0 - 10.5 K/uL   RBC 4.28  3.87 - 5.11  MIL/uL   Hemoglobin 11.0 (*) 12.0 - 15.0 g/dL   HCT 16.1 (*) 09.6 - 04.5 %   MCV 79.9  78.0 - 100.0 fL   MCH 25.7 (*) 26.0 - 34.0 pg   MCHC 32.2  30.0 - 36.0 g/dL   RDW 40.9  81.1 - 91.4 %   Platelets 325  150 - 400 K/uL   Neutrophils Relative 47  43 - 77 %   Neutro Abs 2.3  1.7 - 7.7 K/uL   Lymphocytes Relative 41  12 - 46 %   Lymphs Abs 2.0  0.7 - 4.0 K/uL   Monocytes Relative 9  3 - 12 %   Monocytes Absolute 0.4  0.1 - 1.0 K/uL   Eosinophils Relative 4  0 - 5 %   Eosinophils Absolute 0.2  0.0 - 0.7 K/uL   Basophils Relative 0  0 - 1 %   Basophils Absolute 0.0  0.0 - 0.1 K/uL  BASIC METABOLIC PANEL      Component Value Range   Sodium 134 (*) 135 - 145 mEq/L   Potassium 3.8  3.5 - 5.1 mEq/L   Chloride 101  96 - 112 mEq/L   CO2 24  19 - 32 mEq/L   Glucose, Bld 120 (*) 70 - 99 mg/dL   BUN 20  6 - 23 mg/dL   Creatinine, Ser 7.82 (*) 0.50 - 1.10 mg/dL   Calcium 9.6  8.4 - 95.6 mg/dL   GFR calc non Af Amer 55 (*) >90 mL/min   GFR calc Af Amer 64 (*) >90 mL/min  URINALYSIS, ROUTINE W REFLEX MICROSCOPIC      Component Value Range   Color, Urine YELLOW  YELLOW   APPearance CLEAR  CLEAR   Specific Gravity, Urine 1.007  1.005 - 1.030   pH 7.0  5.0 - 8.0   Glucose, UA NEGATIVE  NEGATIVE mg/dL   Hgb urine dipstick NEGATIVE  NEGATIVE   Bilirubin Urine NEGATIVE  NEGATIVE   Ketones, ur NEGATIVE  NEGATIVE mg/dL   Protein, ur NEGATIVE  NEGATIVE mg/dL   Urobilinogen, UA 0.2  0.0 - 1.0 mg/dL   Nitrite NEGATIVE  NEGATIVE   Leukocytes, UA NEGATIVE  NEGATIVE       1. Abdominal pain       MDM  10:30 PM patient seen and evaluated. Patient lying comfortably in bed in no apparent distress. Patient reports having similar intermittent cramping pains following C-section 2 months ago. We'll evaluate with basic labs and treat with pain medication and IV fluids.   Labs unremarkable. Patient with normal WBC. Patient has history of same chronic pains. She has had significant improvements  after pain medicine is requesting to leave at this time. Patient instructed followup with PCP     Phill Mutter  [place], [place] 08/21/11 [phone]

## 2011-08-20 NOTE — ED Notes (Signed)
Pt c/o right lower quad pain; knot noted below c section scar; feels like having contractions x 1 wk

## 2011-08-20 NOTE — ED Notes (Signed)
c section 06/07/11 with infection complications

## 2011-08-21 LAB — URINALYSIS, ROUTINE W REFLEX MICROSCOPIC
Bilirubin Urine: NEGATIVE
Hgb urine dipstick: NEGATIVE
Ketones, ur: NEGATIVE mg/dL
Specific Gravity, Urine: 1.007 (ref 1.005–1.030)
Urobilinogen, UA: 0.2 mg/dL (ref 0.0–1.0)

## 2011-08-21 LAB — BASIC METABOLIC PANEL
BUN: 20 mg/dL (ref 6–23)
Calcium: 9.6 mg/dL (ref 8.4–10.5)
Creatinine, Ser: 1.22 mg/dL — ABNORMAL HIGH (ref 0.50–1.10)
GFR calc non Af Amer: 55 mL/min — ABNORMAL LOW (ref >90)
Glucose, Bld: 120 mg/dL — ABNORMAL HIGH (ref 70–99)
Potassium: 3.8 mEq/L (ref 3.5–5.1)

## 2011-08-21 MED ORDER — HYDROCODONE-ACETAMINOPHEN 5-325 MG PO TABS: 1.0000 | ORAL_TABLET | ORAL | Status: AC | PRN

## 2011-08-21 NOTE — ED Notes (Signed)
Lab called to verify UA specimen label with requisition that did not match. Pt was able to give specimen again and sent for UA.

## 2011-08-21 NOTE — ED Provider Notes (Signed)
Medical screening examination/treatment/procedure(s) were performed by non-physician practitioner and as supervising physician I was immediately available for consultation/collaboration.  Kesean Serviss T Kirstie Larsen, MD 08/21/11 0913 

## 2011-10-21 ENCOUNTER — Emergency Department (HOSPITAL_BASED_OUTPATIENT_CLINIC_OR_DEPARTMENT_OTHER)
Admission: EM | Admit: 2011-10-21 | Discharge: 2011-10-21 | Disposition: A | Discharge status: Home / Self Care | Payer: Self-pay | Attending: Emergency Medicine | Admitting: Emergency Medicine

## 2011-10-21 ENCOUNTER — Encounter (HOSPITAL_BASED_OUTPATIENT_CLINIC_OR_DEPARTMENT_OTHER): Payer: Self-pay | Admitting: Emergency Medicine

## 2011-10-21 DIAGNOSIS — R7309 Other abnormal glucose: Secondary | ICD-10-CM | POA: Insufficient documentation

## 2011-10-21 DIAGNOSIS — F17200 Nicotine dependence, unspecified, uncomplicated: Secondary | ICD-10-CM | POA: Insufficient documentation

## 2011-10-21 DIAGNOSIS — M545 Low back pain: Secondary | ICD-10-CM

## 2011-10-21 DIAGNOSIS — R739 Hyperglycemia, unspecified: Secondary | ICD-10-CM

## 2011-10-21 DIAGNOSIS — M5126 Other intervertebral disc displacement, lumbar region: Secondary | ICD-10-CM | POA: Insufficient documentation

## 2011-10-21 LAB — BASIC METABOLIC PANEL
BUN: 9 mg/dL (ref 6–23)
Chloride: 102 mEq/L (ref 96–112)
GFR calc non Af Amer: >90 mL/min (ref >90)
Glucose, Bld: 149 mg/dL — ABNORMAL HIGH (ref 70–99)
Potassium: 3.6 mEq/L (ref 3.5–5.1)

## 2011-10-21 MED ORDER — HYDROCODONE-ACETAMINOPHEN 5-325 MG PO TABS: 1.0000 | ORAL_TABLET | Freq: Four times a day (QID) | ORAL | Status: DC | PRN

## 2011-10-21 MED ORDER — CYCLOBENZAPRINE HCL 5 MG PO TABS: 5.0000 mg | ORAL_TABLET | Freq: Three times a day (TID) | ORAL | Status: DC | PRN

## 2011-10-21 MED ORDER — HYDROCODONE-ACETAMINOPHEN 5-325 MG PO TABS: 1.0000 | ORAL_TABLET | Freq: Once | ORAL | Status: DC

## 2011-10-21 MED ORDER — HYDROCODONE-ACETAMINOPHEN 5-325 MG PO TABS
1.0000 | ORAL_TABLET | Freq: Once | ORAL | Status: AC
  Administered 2011-10-21: 2 via ORAL
  Filled 2011-10-21: qty 2

## 2011-10-21 MED ORDER — IBUPROFEN 600 MG PO TABS: 600.0000 mg | ORAL_TABLET | Freq: Three times a day (TID) | ORAL | Status: DC | PRN

## 2011-10-21 MED ORDER — ONDANSETRON HCL 4 MG PO TABS: 4.0000 mg | ORAL_TABLET | Freq: Three times a day (TID) | ORAL | Status: DC | PRN

## 2011-10-21 NOTE — ED Notes (Signed)
C/o of back pain x 1 week- states has bulging disc of L5/6- states pain radiates To both buttocks

## 2011-10-21 NOTE — ED Provider Notes (Signed)
History     CSN: [id number]  Arrival date & time 10/21/11  2956   First MD Initiated Contact with Patient 10/21/11 [phone]      Chief Complaint  Patient presents with  . Back Pain    (Consider location/radiation/quality/duration/timing/severity/associated sxs/prior treatment) HPI  Alexa Sims is a 41 yo woman with PMH significant for lumbar herniated disc and recent C-section (4 months post-partum) who presents to the [facility] ED for severe low back pain that started last night. 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 The pain has been so severe that she felt nauseated early this morning and had 4 episodes of non-bloody emesis. She applied a heat pad to her lower back last night, soaked in a warm bath and took 3 X-strength Tylenol this morning with no improvement of her pain. She denies fall, trauma, or lifting heavy objects,  bowel or bladder incontinence, urinary retention, or loss of sensation in her LE.    She has had back pain in the past and which was managed with Flerexil and Norco by Dr. Dorina Hoyer in [place], however, due to insurance problems she has not seen that provider in months and has ran out of her pain medications. She recently stopped breastfeeding her 1-month-old daughter and would like to resume her chronic pain medication regimen.    Past Medical History  Diagnosis Date  . No pertinent past medical history   . Lumbar herniated disc 2011    L5  . Depression     Past Surgical History  Procedure Date  . Cholecystectomy   . Knee surgery     partial meniscectomy right knee  . Cesarean section 06/07/2011    Procedure: CESAREAN SECTION;  Surgeon: Loney Laurence, MD;  Location: [facility];  Service: Gynecology;  Laterality: N/A;    Family History  Problem  Relation Age of Onset  . Hyperlipidemia Father   . Hypertension Father   . Heart attack Father   . Rheum arthritis Sister   . Sudden death Paternal Uncle   . Hypertension Maternal Grandmother   . Diabetes Neg Hx   . Anesthesia problems Neg Hx   . Birth defects Brother     History  Substance Use Topics  . Smoking status: Current Every Day Smoker -- 0.2 packs/day    Types: Cigarettes  . Smokeless tobacco: Never Used  . Alcohol Use: Yes     socially    OB History    Grav Para Term Preterm Abortions TAB SAB Ect Mult Living   3 3 3  0 0 0 0 0 0 3      Review of Systems  Constitutional: Negative for fever, chills, diaphoresis, activity change, appetite change and fatigue.  HENT: Positive for voice change.   Respiratory: Negative for cough, choking, chest tightness and shortness of breath.   Cardiovascular: Negative for chest pain.  Gastrointestinal: Positive for nausea, vomiting and abdominal pain. Negative for diarrhea.  Genitourinary: Positive for frequency. Negative for dysuria and pelvic pain.  Musculoskeletal: Positive for back pain. Negative for gait problem.  Skin: Negative for rash.  Neurological: Positive for weakness. Negative for dizziness, syncope, light-headedness and headaches.  Psychiatric/Behavioral: Negative for behavioral problems.    Allergies  Aspirin and Tramadol  Home Medications   Current Outpatient Rx  Name Route Sig Dispense Refill  . HYDROCODONE-ACETAMINOPHEN 10-325 MG PO TABS Oral Take 1 tablet by mouth every 6 (six) hours as needed.    . VENLAFAXINE HCL ER 150 MG PO CP24 Oral Take 150 mg by mouth daily.    . IBUPROFEN 200 MG PO TABS Oral Take 400 mg by mouth every 8 (eight) hours as needed. For pain.    . ADULT MULTIVITAMIN W/MINERALS CH Oral Take 1 tablet by mouth daily.    Marland Kitchen PROBIOTIC PO Oral Take 1 capsule by mouth daily.      BP 128/77  Pulse 88  Temp 98 F (36.7 C) (Oral)  Resp 18  Ht 5\' 3"  (1.6 m)  Wt 135 lb (61.236 kg)  BMI  23.91 kg/m2  SpO2 98%  LMP 10/14/2011  Breastfeeding? No  Physical Exam  Constitutional: She is oriented to person, place, and time. She appears well-developed and well-nourished. No distress.  HENT:  Head: Atraumatic.  Eyes: Conjunctivae normal are normal. Right eye exhibits no discharge. Left eye exhibits no discharge. No scleral icterus.  Neck: Neck supple.  Cardiovascular: Normal rate, regular rhythm, normal heart sounds and intact distal pulses.  Exam reveals no gallop and no friction rub.   No murmur heard. Pulmonary/Chest: Effort normal and breath sounds normal. No respiratory distress. She has no wheezes. She has no rales. She exhibits no tenderness.       Expiratory stridor, cleared by cough  Abdominal: Soft. Bowel sounds are normal. She exhibits no distension. There is no tenderness. There is no guarding.  Musculoskeletal: Normal range of motion. She exhibits tenderness. She exhibits no edema.       Paraspinal tenderness of lower back Negative straight leg raise test.  Strength 5/5 in UE and LE Intact sensation of LE Normal hip flexion/extension bilaterally.  Normal dorsiflexion/plantar flexion bilaterally  Neurological: She is alert and oriented to person, place, and time.  Skin: Skin is warm and dry. No rash noted. She is not diaphoretic. No erythema. No pallor.  Psychiatric: She has a normal mood and affect. Her behavior is normal.    ED Course  Procedures (including critical care time)  Labs Reviewed  BASIC METABOLIC PANEL - Abnormal; Notable for the following:    Glucose, Bld 149 (*)     All other components within normal limits   No results found.   No diagnosis found.    MDM  41 yo woman with PMH of bulging L5 disc, 4 month post C-section now with ow back pain following remodel project.   Lumbar pain. This is most likely acute on chronic, secondary to repeated movement during the building/remodeling project. She has responded well to NSAIDs, Flexeril,  and Norco in the past. She denies nausea at this time.   Hyperglycemia. Pt reports having gestational diabetes, now with polyuria. She will need to follow up with PCP.  Plan: -Norco now with PRN prescription at home for back pain -Flexeril PRN for back pain -Zofran PRN for nausea -Pt to follow up with her PCP, Dr. Criss Alvine in [facility]. Pt will be discharged home with instructions per above.

## 2011-10-22 NOTE — ED Provider Notes (Signed)
Medical screening examination/treatment/procedure(s) were performed by non-physician practitioner and as supervising physician I was immediately available for consultation/collaboration.    Nelia Shi, MD 10/22/11 [phone]

## 2012-01-03 ENCOUNTER — Emergency Department (HOSPITAL_BASED_OUTPATIENT_CLINIC_OR_DEPARTMENT_OTHER)
Admission: EM | Admit: 2012-01-03 | Discharge: 2012-01-03 | Disposition: A | Discharge status: Home / Self Care | Payer: Self-pay | Attending: Emergency Medicine | Admitting: Emergency Medicine

## 2012-01-03 ENCOUNTER — Encounter (HOSPITAL_BASED_OUTPATIENT_CLINIC_OR_DEPARTMENT_OTHER): Payer: Self-pay | Admitting: *Deleted

## 2012-01-03 ENCOUNTER — Emergency Department (HOSPITAL_BASED_OUTPATIENT_CLINIC_OR_DEPARTMENT_OTHER): Payer: Self-pay

## 2012-01-03 DIAGNOSIS — Z9889 Other specified postprocedural states: Secondary | ICD-10-CM | POA: Insufficient documentation

## 2012-01-03 DIAGNOSIS — F17200 Nicotine dependence, unspecified, uncomplicated: Secondary | ICD-10-CM | POA: Insufficient documentation

## 2012-01-03 DIAGNOSIS — Z79899 Other long term (current) drug therapy: Secondary | ICD-10-CM | POA: Insufficient documentation

## 2012-01-03 DIAGNOSIS — R0602 Shortness of breath: Secondary | ICD-10-CM | POA: Insufficient documentation

## 2012-01-03 DIAGNOSIS — J189 Pneumonia, unspecified organism: Secondary | ICD-10-CM

## 2012-01-03 DIAGNOSIS — R062 Wheezing: Secondary | ICD-10-CM | POA: Insufficient documentation

## 2012-01-03 DIAGNOSIS — Z8659 Personal history of other mental and behavioral disorders: Secondary | ICD-10-CM | POA: Insufficient documentation

## 2012-01-03 MED ORDER — ALBUTEROL SULFATE HFA 108 (90 BASE) MCG/ACT IN AERS: 2.0000 | INHALATION_SPRAY | RESPIRATORY_TRACT | Status: DC | PRN

## 2012-01-03 MED ORDER — AZITHROMYCIN 250 MG PO TABS: ORAL_TABLET | ORAL | Status: DC

## 2012-01-03 MED ORDER — LIDOCAINE HCL (PF) 1 % IJ SOLN
INTRAMUSCULAR | Status: AC
  Administered 2012-01-03: 19:00:00
  Filled 2012-01-03: qty 5

## 2012-01-03 MED ORDER — ALBUTEROL SULFATE (5 MG/ML) 0.5% IN NEBU
INHALATION_SOLUTION | RESPIRATORY_TRACT | Status: AC
  Administered 2012-01-03: 5 mg
  Filled 2012-01-03: qty 1

## 2012-01-03 MED ORDER — AZITHROMYCIN 250 MG PO TABS
500.0000 mg | ORAL_TABLET | Freq: Once | ORAL | Status: AC
  Administered 2012-01-03: 500 mg via ORAL
  Filled 2012-01-03: qty 2

## 2012-01-03 MED ORDER — IPRATROPIUM BROMIDE 0.02 % IN SOLN
RESPIRATORY_TRACT | Status: AC
  Administered 2012-01-03: 0.5 mg
  Filled 2012-01-03: qty 2.5

## 2012-01-03 MED ORDER — HYDROCODONE-ACETAMINOPHEN 5-325 MG PO TABS: 2.0000 | ORAL_TABLET | ORAL | Status: DC | PRN

## 2012-01-03 MED ORDER — CEFTRIAXONE SODIUM 1 G IJ SOLR
1.0000 g | Freq: Once | INTRAMUSCULAR | Status: AC
  Administered 2012-01-03: 1 g via INTRAMUSCULAR
  Filled 2012-01-03: qty 10

## 2012-01-03 NOTE — ED Provider Notes (Signed)
Medical screening examination/treatment/procedure(s) were performed by non-physician practitioner and as supervising physician I was immediately available for consultation/collaboration.   David H Yao, MD 01/03/12 2349 

## 2012-01-03 NOTE — ED Provider Notes (Signed)
History     CSN: [id number]  Arrival date & time 01/03/12  1629   First MD Initiated Contact with Patient 01/03/12 1658      Chief Complaint  Patient presents with  . Fever  . Cough    (Consider location/radiation/quality/duration/timing/severity/associated sxs/prior treatment) Patient is a 41 y.o. female presenting with cough. The history is provided by the patient. No language interpreter was used.  Cough This is a new problem. The current episode started more than 1 week ago. The problem occurs constantly. The problem has not changed since onset.The cough is non-productive. Associated symptoms include shortness of breath and wheezing. She has tried nothing for the symptoms. The treatment provided no relief. She is a smoker. Her past medical history is significant for bronchitis. Her past medical history does not include pneumonia.   Pt complains of cough and congestion Past Medical History  Diagnosis Date  . No pertinent past medical history   . Lumbar herniated disc 2011    L5  . Depression     Past Surgical History  Procedure Date  . Cholecystectomy   . Knee surgery     partial meniscectomy right knee  . Cesarean section 06/07/2011    Procedure: CESAREAN SECTION;  Surgeon: Loney Laurence, MD;  Location: [facility];  Service: Gynecology;  Laterality: N/A;    Family History  Problem Relation Age of Onset  . Hyperlipidemia Father   . Hypertension Father   . Heart attack Father   . Rheum arthritis Sister   . Sudden death Paternal Uncle   . Hypertension Maternal Grandmother   . Diabetes Neg Hx   . Anesthesia problems Neg Hx   . Birth defects Brother     History  Substance Use Topics  . Smoking status: Current Every Day Smoker -- 0.2 packs/day    Types: Cigarettes  . Smokeless tobacco: Never Used  . Alcohol Use: 0.5 oz/week    1 drink(s) per week    OB History    Grav Para Term Preterm Abortions TAB SAB Ect Mult Living   3 3 3  0 0 0 0 0 0 3      Review  of Systems  Respiratory: Positive for cough, shortness of breath and wheezing.   All other systems reviewed and are negative.    Allergies  Aspirin and Tramadol  Home Medications   Current Outpatient Rx  Name  Route  Sig  Dispense  Refill  . CYCLOBENZAPRINE HCL 5 MG PO TABS   Oral   Take 1 tablet (5 mg total) by mouth 3 (three) times daily as needed for muscle spasms.   30 tablet   0   . HYDROCODONE-ACETAMINOPHEN 10-325 MG PO TABS   Oral   Take 1 tablet by mouth every 6 (six) hours as needed.         . ADULT MULTIVITAMIN W/MINERALS CH   Oral   Take 1 tablet by mouth daily.         Marland Kitchen HYDROCODONE-ACETAMINOPHEN 5-325 MG PO TABS   Oral   Take 1 tablet by mouth every 6 (six) hours as needed for pain.   15 tablet   0   . IBUPROFEN 200 MG PO TABS   Oral   Take 400 mg by mouth every 8 (eight) hours as needed. For pain.         . IBUPROFEN 600 MG PO TABS   Oral   Take 1 tablet (600 mg total) by mouth every 8 (eight)  hours as needed for pain.   30 tablet   0   . ONDANSETRON HCL 4 MG PO TABS   Oral   Take 1 tablet (4 mg total) by mouth every 8 (eight) hours as needed for nausea.   5 tablet   0   . PROBIOTIC PO   Oral   Take 1 capsule by mouth daily.         . VENLAFAXINE HCL ER 150 MG PO CP24   Oral   Take 150 mg by mouth daily.           BP 131/68  Pulse 78  Temp 98.2 F (36.8 C) (Oral)  Resp 18  Ht 5\' 3"  (1.6 m)  Wt 130 lb (58.968 kg)  BMI 23.03 kg/m2  SpO2 98%  LMP 12/29/2011  Physical Exam  Nursing note and vitals reviewed. Constitutional: She appears well-developed and well-nourished.  HENT:  Head: Normocephalic and atraumatic.  Right Ear: External ear normal.  Left Ear: External ear normal.  Eyes: Conjunctivae normal and EOM are normal. Pupils are equal, round, and reactive to light.  Neck: Normal range of motion. Neck supple.  Cardiovascular: Normal rate and normal heart sounds.   Pulmonary/Chest: Effort normal and breath sounds  normal.  Abdominal: Soft.  Musculoskeletal: Normal range of motion.  Neurological: She is alert.  Skin: Skin is warm.    ED Course  Procedures (including critical care time)  Labs Reviewed - No data to display Dg Chest 2 View  01/03/2012  *RADIOLOGY REPORT*  Clinical Data: Cough  CHEST - 2 VIEW  Comparison: 06/27/2009  Findings: Cardiomediastinal silhouette is stable.  There is hazy infiltrate/pneumonia in the right middle lobe.  No pulmonary edema.  IMPRESSION: Hazy infiltrates/pneumonia in the right middle lobe.  No pulmonary edema.   Original Report Authenticated By: Natasha Mead, M.D.      No diagnosis found.    MDM  Pt sounds better after albuterol,   I advised pt of xray findings, Pt given rocephin and zithromax,  Pt given rx for albuterol, zithromax and hydrocodone       Lonia Skinner [place], [place] 01/03/12 1925

## 2012-01-03 NOTE — ED Notes (Signed)
Pt verbalizes understanding 

## 2012-01-03 NOTE — ED Notes (Addendum)
Pt reports congestion x 1 week- forceful coughing with bloody mucous today- c/o chest soreness and "stabbing sensation" x 1 week -reports vomiting, fever last night- also has hx of bulging discs and was supposed to see her doctor for med refill but office was closed today

## 2012-01-03 NOTE — ED Notes (Signed)
Pt called for triage- states she needs to text her husband first

## 2012-07-28 ENCOUNTER — Emergency Department (HOSPITAL_BASED_OUTPATIENT_CLINIC_OR_DEPARTMENT_OTHER): Payer: Self-pay

## 2012-07-28 ENCOUNTER — Emergency Department (HOSPITAL_BASED_OUTPATIENT_CLINIC_OR_DEPARTMENT_OTHER)
Admission: EM | Admit: 2012-07-28 | Discharge: 2012-07-28 | Disposition: A | Discharge status: Home / Self Care | Payer: Self-pay | Attending: Emergency Medicine | Admitting: Emergency Medicine

## 2012-07-28 ENCOUNTER — Encounter (HOSPITAL_BASED_OUTPATIENT_CLINIC_OR_DEPARTMENT_OTHER): Payer: Self-pay | Admitting: *Deleted

## 2012-07-28 DIAGNOSIS — F329 Major depressive disorder, single episode, unspecified: Secondary | ICD-10-CM | POA: Insufficient documentation

## 2012-07-28 DIAGNOSIS — Z79899 Other long term (current) drug therapy: Secondary | ICD-10-CM | POA: Insufficient documentation

## 2012-07-28 DIAGNOSIS — Z8739 Personal history of other diseases of the musculoskeletal system and connective tissue: Secondary | ICD-10-CM | POA: Insufficient documentation

## 2012-07-28 DIAGNOSIS — N390 Urinary tract infection, site not specified: Secondary | ICD-10-CM | POA: Insufficient documentation

## 2012-07-28 DIAGNOSIS — S058X9A Other injuries of unspecified eye and orbit, initial encounter: Secondary | ICD-10-CM | POA: Insufficient documentation

## 2012-07-28 DIAGNOSIS — F17200 Nicotine dependence, unspecified, uncomplicated: Secondary | ICD-10-CM | POA: Insufficient documentation

## 2012-07-28 DIAGNOSIS — S0083XA Contusion of other part of head, initial encounter: Secondary | ICD-10-CM

## 2012-07-28 DIAGNOSIS — S0500XA Injury of conjunctiva and corneal abrasion without foreign body, unspecified eye, initial encounter: Secondary | ICD-10-CM | POA: Insufficient documentation

## 2012-07-28 DIAGNOSIS — S0501XA Injury of conjunctiva and corneal abrasion without foreign body, right eye, initial encounter: Secondary | ICD-10-CM

## 2012-07-28 DIAGNOSIS — S0003XA Contusion of scalp, initial encounter: Secondary | ICD-10-CM | POA: Insufficient documentation

## 2012-07-28 DIAGNOSIS — F3289 Other specified depressive episodes: Secondary | ICD-10-CM | POA: Insufficient documentation

## 2012-07-28 DIAGNOSIS — W1809XA Striking against other object with subsequent fall, initial encounter: Secondary | ICD-10-CM | POA: Insufficient documentation

## 2012-07-28 DIAGNOSIS — Y9389 Activity, other specified: Secondary | ICD-10-CM | POA: Insufficient documentation

## 2012-07-28 DIAGNOSIS — Y9229 Other specified public building as the place of occurrence of the external cause: Secondary | ICD-10-CM | POA: Insufficient documentation

## 2012-07-28 LAB — URINALYSIS, ROUTINE W REFLEX MICROSCOPIC
Specific Gravity, Urine: 1.017 (ref 1.005–1.030)
pH: 6 (ref 5.0–8.0)

## 2012-07-28 LAB — URINE MICROSCOPIC-ADD ON

## 2012-07-28 MED ORDER — HYDROCODONE-ACETAMINOPHEN 5-325 MG PO TABS
2.0000 | ORAL_TABLET | Freq: Once | ORAL | Status: AC
  Administered 2012-07-28: 2 via ORAL
  Filled 2012-07-28: qty 2

## 2012-07-28 MED ORDER — TOBRAMYCIN 0.3 % OP SOLN: 1.0000 [drp] | OPHTHALMIC | Status: DC

## 2012-07-28 MED ORDER — AMOXICILLIN 500 MG PO CAPS: 500.0000 mg | ORAL_CAPSULE | Freq: Three times a day (TID) | ORAL | Status: DC

## 2012-07-28 MED ORDER — HYDROCODONE-ACETAMINOPHEN 5-325 MG PO TABS: 2.0000 | ORAL_TABLET | ORAL | Status: DC | PRN

## 2012-07-28 MED ORDER — FLUORESCEIN SODIUM 1 MG OP STRP
ORAL_STRIP | OPHTHALMIC | Status: AC
  Administered 2012-07-28: 1 via OPHTHALMIC
  Filled 2012-07-28: qty 1

## 2012-07-28 MED ORDER — FLUORESCEIN SODIUM 1 MG OP STRP
1.0000 | ORAL_STRIP | Freq: Once | OPHTHALMIC | Status: AC
  Administered 2012-07-28: 1 via OPHTHALMIC

## 2012-07-28 MED ORDER — TETRACAINE HCL 0.5 % OP SOLN
2.0000 [drp] | Freq: Once | OPHTHALMIC | Status: AC
  Administered 2012-07-28: 2 [drp] via OPHTHALMIC
  Filled 2012-07-28: qty 2

## 2012-07-28 NOTE — ED Notes (Signed)
Pt report right eye injury x 2 hrs ago

## 2012-07-28 NOTE — ED Notes (Signed)
Yelling heard from exam room went to the door to ask if this patient needs assistance she is on cell phone yelling.

## 2012-07-28 NOTE — ED Notes (Signed)
Received call from a man named Richardo Priest, states he is the husband of Alexa Sims.  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  States he will not come to get her except to drive her to rehab.  K. Sophia, PAC spoke with the patient and she denied using any drugs and states she does not want any help.

## 2012-07-28 NOTE — ED Notes (Signed)
Pt standing at door of exam room speaking loudly asking how she calls the doctor. Pt back in bed this RN  Showed pt the call bell which is attached to the side rail which is up. Pt states she needs something for pain explained that must be evaluated by provider before we can give any medications . Pt back in bed.

## 2012-07-28 NOTE — ED Provider Notes (Signed)
History    CSN: [id number] Arrival date & time 07/28/12  1258  First MD Initiated Contact with Patient 07/28/12 1332     Chief Complaint  Patient presents with  . Eye Pain   (Consider location/radiation/quality/duration/timing/severity/associated sxs/prior Treatment) Patient is a 42 y.o. female presenting with fall. The history is provided by the patient. No language interpreter was used.  Fall This is a new problem. The current episode started today. The problem occurs constantly. The problem has been unchanged. Nothing aggravates the symptoms. She has tried nothing for the symptoms. The treatment provided moderate relief.  Pt reports she tripped over hotel room trash can and hit the side of her head and right eye on granite counter.  No loc,  Pt complains of pain to right eye and right side of face.   Past Medical History  Diagnosis Date  . No pertinent past medical history   . Lumbar herniated disc 2011    L5  . Depression    Past Surgical History  Procedure Laterality Date  . Cholecystectomy    . Knee surgery      partial meniscectomy right knee  . Cesarean section  06/07/2011    Procedure: CESAREAN SECTION;  Surgeon: Loney Laurence, MD;  Location: [facility];  Service: Gynecology;  Laterality: N/A;   Family History  Problem Relation Age of Onset  . Hyperlipidemia Father   . Hypertension Father   . Heart attack Father   . Rheum arthritis Sister   . Sudden death Paternal Uncle   . Hypertension Maternal Grandmother   . Diabetes Neg Hx   . Anesthesia problems Neg Hx   . Birth defects Brother    History  Substance Use Topics  . Smoking status: Current Every Day Smoker -- 0.50 packs/day    Types: Cigarettes  . Smokeless tobacco: Never Used  . Alcohol Use: No   OB History   Grav Para Term Preterm Abortions TAB SAB Ect Mult Living   3 3 3  0 0 0 0 0 0 3     Review of Systems  All other systems reviewed and are negative.    Allergies  Aspirin and  Tramadol  Home Medications   Current Outpatient Rx  Name  Route  Sig  Dispense  Refill  . ALPRAZolam (XANAX) 0.5 MG tablet   Oral   Take 0.5 mg by mouth at bedtime as needed for sleep.         Marland Kitchen amphetamine-dextroamphetamine (ADDERALL) 10 MG tablet   Oral   Take 10 mg by mouth daily.         Marland Kitchen albuterol (PROVENTIL HFA;VENTOLIN HFA) 108 (90 BASE) MCG/ACT inhaler   Inhalation   Inhale 2 puffs into the lungs every 4 (four) hours as needed for wheezing.   1 Inhaler   0   . azithromycin (ZITHROMAX Z-PAK) 250 MG tablet      One tablet a day days 2-5   4 tablet   0   . cyclobenzaprine (FLEXERIL) 5 MG tablet   Oral   Take 1 tablet (5 mg total) by mouth 3 (three) times daily as needed for muscle spasms.   30 tablet   0   . HYDROcodone-acetaminophen (NORCO) 10-325 MG per tablet   Oral   Take 1 tablet by mouth every 6 (six) hours as needed.         Marland Kitchen HYDROcodone-acetaminophen (NORCO) 5-325 MG per tablet   Oral   Take 1 tablet by mouth every  6 (six) hours as needed for pain.   15 tablet   0   . HYDROcodone-acetaminophen (NORCO/VICODIN) 5-325 MG per tablet   Oral   Take 2 tablets by mouth every 4 (four) hours as needed for pain.   10 tablet   0   . ibuprofen (ADVIL,MOTRIN) 200 MG tablet   Oral   Take 400 mg by mouth every 8 (eight) hours as needed. For pain.         Marland Kitchen ibuprofen (ADVIL,MOTRIN) 600 MG tablet   Oral   Take 1 tablet (600 mg total) by mouth every 8 (eight) hours as needed for pain.   30 tablet   0   . Multiple Vitamin (MULTIVITAMIN WITH MINERALS) TABS   Oral   Take 1 tablet by mouth daily.         . ondansetron (ZOFRAN) 4 MG tablet   Oral   Take 1 tablet (4 mg total) by mouth every 8 (eight) hours as needed for nausea.   5 tablet   0   . Probiotic Product (PROBIOTIC PO)   Oral   Take 1 capsule by mouth daily.         Marland Kitchen venlafaxine XR (EFFEXOR-XR) 150 MG 24 hr capsule   Oral   Take 150 mg by mouth daily.          BP 113/80   Pulse 87  Temp(Src) 97.9 F (36.6 C) (Oral)  Resp 15  Ht 5\' 3"  (1.6 m)  Wt 134 lb (60.782 kg)  BMI 23.74 kg/m2  SpO2 95%  LMP 07/28/2012 Physical Exam  Vitals reviewed. Constitutional: She appears well-developed and well-nourished.  HENT:  Head: Normocephalic.  Right Ear: External ear normal.  Nose: Nose normal.  Mouth/Throat: Oropharynx is clear and moist.  Tender right forehead  Eyes: EOM are normal. Pupils are equal, round, and reactive to light. Right conjunctiva is injected.  Slit lamp exam:      The right eye shows corneal abrasion and fluorescein uptake.  Erythema right eye,    Neck: Normal range of motion. Neck supple.  Cardiovascular: Normal rate.   Pulmonary/Chest: Effort normal.  Musculoskeletal: Normal range of motion.  Neurological: She is alert.  Skin: Skin is warm.    ED Course  Procedures (including critical care time) Labs Reviewed  URINALYSIS, ROUTINE W REFLEX MICROSCOPIC - Abnormal; Notable for the following:    APPearance CLOUDY (*)    Hgb urine dipstick LARGE (*)    Bilirubin Urine SMALL (*)    Nitrite POSITIVE (*)    Leukocytes, UA SMALL (*)    All other components within normal limits  URINE MICROSCOPIC-ADD ON - Abnormal; Notable for the following:    Squamous Epithelial / LPF FEW (*)    Bacteria, UA MANY (*)    All other components within normal limits  URINE CULTURE   Ct Head Wo Contrast  07/28/2012   *RADIOLOGY REPORT*  Clinical Data:  Fall.  Trauma to right forehead and eye.  Pain.  CT HEAD AND ORBITS WITHOUT CONTRAST  Technique:  Contiguous axial images were obtained from the base of the skull through the vertex without contrast. Multidetector CT imaging of the orbits was performed using the standard protocol without intravenous contrast.  Comparison:  CT head without contrast 07/18/2012.  CT HEAD  Findings: No acute cortical infarct, hemorrhage, or mass lesion is present.  The ventricles are of normal size.  No significant extra- axial fluid  collection is present.  The visualized paranasal sinuses  and mastoid air cells are clear. The osseous skull is intact.  IMPRESSION: Negative CT of the head.  CT ORBITS  Findings: Of minimal superolateral soft tissue swelling is noted along the right orbital rim.  There is no underlying fracture.  The globe is intact.  The orbit is unremarkable.  The visualized paranasal sinuses are clear.  The nasal bones are intact.  IMPRESSION: Minimal superolateral soft tissue swelling adjacent to the right orbit without an underlying fracture or globe injury.   Original Report Authenticated By: Marin Roberts, M.D.   Ct Orbitss W/o Cm  07/28/2012   *RADIOLOGY REPORT*  Clinical Data:  Fall.  Trauma to right forehead and eye.  Pain.  CT HEAD AND ORBITS WITHOUT CONTRAST  Technique:  Contiguous axial images were obtained from the base of the skull through the vertex without contrast. Multidetector CT imaging of the orbits was performed using the standard protocol without intravenous contrast.  Comparison:  CT head without contrast 07/18/2012.  CT HEAD  Findings: No acute cortical infarct, hemorrhage, or mass lesion is present.  The ventricles are of normal size.  No significant extra- axial fluid collection is present.  The visualized paranasal sinuses and mastoid air cells are clear. The osseous skull is intact.  IMPRESSION: Negative CT of the head.  CT ORBITS  Findings: Of minimal superolateral soft tissue swelling is noted along the right orbital rim.  There is no underlying fracture.  The globe is intact.  The orbit is unremarkable.  The visualized paranasal sinuses are clear.  The nasal bones are intact.  IMPRESSION: Minimal superolateral soft tissue swelling adjacent to the right orbit without an underlying fracture or globe injury.   Original Report Authenticated By: Marin Roberts, M.D.   1. Injury of conjunctiva and corneal abrasion of right eye without foreign body, initial encounter   2. Contusion of face,  initial encounter     MDM  Family member called and reports concerns that pt is using heroin and wants pt to go into treatment.   Luella Cook RN and myself spoke to pt,  She denies heroin use.  Pt reports she had been on pain medication (hydrocodone)  for chronic pain.   Pt reports she stopped taking.   Pt has a corneal abrasion to right eye.   I advised her I will give her limit amount of vicodin  6 tablets and tobrex.        I advised pt if she needs help with substance abuse to return.   Urine shows a uti.   Pt given rx for keflex   Elson Areas, PA-C 07/28/12 1830

## 2012-07-29 ENCOUNTER — Emergency Department (HOSPITAL_COMMUNITY): Payer: Self-pay

## 2012-07-29 ENCOUNTER — Emergency Department (HOSPITAL_COMMUNITY)
Admission: EM | Admit: 2012-07-29 | Discharge: 2012-07-30 | Disposition: A | Discharge status: Other Acute Inpatient Hospital | Payer: No Typology Code available for payment source | Attending: Emergency Medicine | Admitting: Emergency Medicine

## 2012-07-29 ENCOUNTER — Encounter (HOSPITAL_COMMUNITY): Payer: Self-pay

## 2012-07-29 DIAGNOSIS — F411 Generalized anxiety disorder: Secondary | ICD-10-CM | POA: Insufficient documentation

## 2012-07-29 DIAGNOSIS — R45851 Suicidal ideations: Secondary | ICD-10-CM | POA: Insufficient documentation

## 2012-07-29 DIAGNOSIS — O09529 Supervision of elderly multigravida, unspecified trimester: Secondary | ICD-10-CM | POA: Insufficient documentation

## 2012-07-29 DIAGNOSIS — Z79899 Other long term (current) drug therapy: Secondary | ICD-10-CM | POA: Insufficient documentation

## 2012-07-29 DIAGNOSIS — F3289 Other specified depressive episodes: Secondary | ICD-10-CM | POA: Insufficient documentation

## 2012-07-29 DIAGNOSIS — F329 Major depressive disorder, single episode, unspecified: Secondary | ICD-10-CM | POA: Insufficient documentation

## 2012-07-29 DIAGNOSIS — Z8739 Personal history of other diseases of the musculoskeletal system and connective tissue: Secondary | ICD-10-CM | POA: Insufficient documentation

## 2012-07-29 DIAGNOSIS — F39 Unspecified mood [affective] disorder: Secondary | ICD-10-CM

## 2012-07-29 DIAGNOSIS — O99345 Other mental disorders complicating the puerperium: Secondary | ICD-10-CM

## 2012-07-29 DIAGNOSIS — O99340 Other mental disorders complicating pregnancy, unspecified trimester: Secondary | ICD-10-CM | POA: Insufficient documentation

## 2012-07-29 DIAGNOSIS — O99330 Smoking (tobacco) complicating pregnancy, unspecified trimester: Secondary | ICD-10-CM | POA: Insufficient documentation

## 2012-07-29 DIAGNOSIS — F1120 Opioid dependence, uncomplicated: Secondary | ICD-10-CM | POA: Insufficient documentation

## 2012-07-29 DIAGNOSIS — F1920 Other psychoactive substance dependence, uncomplicated: Secondary | ICD-10-CM | POA: Insufficient documentation

## 2012-07-29 LAB — COMPREHENSIVE METABOLIC PANEL
AST: 33 U/L (ref 0–37)
CO2: 26 mEq/L (ref 19–32)
Calcium: 9.4 mg/dL (ref 8.4–10.5)
Chloride: 102 mEq/L (ref 96–112)
Creatinine, Ser: 0.7 mg/dL (ref 0.50–1.10)
GFR calc Af Amer: >90 mL/min (ref >90)
GFR calc non Af Amer: >90 mL/min (ref >90)
Glucose, Bld: 95 mg/dL (ref 70–99)
Total Bilirubin: 0.2 mg/dL — ABNORMAL LOW (ref 0.3–1.2)

## 2012-07-29 LAB — HCG, QUANTITATIVE, PREGNANCY: hCG, Beta Chain, Quant, S: 4170 m[IU]/mL — ABNORMAL HIGH (ref <5)

## 2012-07-29 LAB — POCT PREGNANCY, URINE: Preg Test, Ur: POSITIVE — AB

## 2012-07-29 LAB — RAPID URINE DRUG SCREEN, HOSP PERFORMED
Amphetamines: POSITIVE — AB
Opiates: POSITIVE — AB
Tetrahydrocannabinol: NOT DETECTED

## 2012-07-29 LAB — WET PREP, GENITAL: Yeast Wet Prep HPF POC: NONE SEEN

## 2012-07-29 LAB — CBC
HCT: 35.8 % — ABNORMAL LOW (ref 36.0–46.0)
Hemoglobin: 12.1 g/dL (ref 12.0–15.0)
MCH: 28.2 pg (ref 26.0–34.0)
MCV: 83.4 fL (ref 78.0–100.0)
RBC: 4.29 MIL/uL (ref 3.87–5.11)
WBC: 5.9 10*3/uL (ref 4.0–10.5)

## 2012-07-29 LAB — SALICYLATE LEVEL: Salicylate Lvl: <2 mg/dL — ABNORMAL LOW (ref 2.8–20.0)

## 2012-07-29 LAB — ABO/RH: ABO/RH(D): A POS

## 2012-07-29 MED ORDER — HYDROXYZINE HCL 25 MG PO TABS: 25.0000 mg | ORAL_TABLET | Freq: Four times a day (QID) | ORAL | Status: DC | PRN

## 2012-07-29 MED ORDER — LOPERAMIDE HCL 2 MG PO CAPS: 2.0000 mg | ORAL_CAPSULE | ORAL | Status: DC | PRN

## 2012-07-29 MED ORDER — CLONIDINE HCL 0.1 MG PO TABS
0.1000 mg | ORAL_TABLET | Freq: Four times a day (QID) | ORAL | Status: DC
  Administered 2012-07-29: 0.1 mg via ORAL
  Filled 2012-07-29 (×2): qty 1

## 2012-07-29 MED ORDER — METHOCARBAMOL 500 MG PO TABS: 500.0000 mg | ORAL_TABLET | Freq: Three times a day (TID) | ORAL | Status: DC | PRN

## 2012-07-29 MED ORDER — DICYCLOMINE HCL 20 MG PO TABS: 20.0000 mg | ORAL_TABLET | Freq: Four times a day (QID) | ORAL | Status: DC | PRN

## 2012-07-29 MED ORDER — TRAZODONE HCL 50 MG PO TABS: 50.0000 mg | ORAL_TABLET | Freq: Every evening | ORAL | Status: DC | PRN

## 2012-07-29 MED ORDER — ONDANSETRON 4 MG PO TBDP: 4.0000 mg | ORAL_TABLET | Freq: Four times a day (QID) | ORAL | Status: DC | PRN

## 2012-07-29 MED ORDER — CLONIDINE HCL 0.1 MG PO TABS: 0.1000 mg | ORAL_TABLET | Freq: Every day | ORAL | Status: DC

## 2012-07-29 MED ORDER — CLONIDINE HCL 0.1 MG PO TABS: 0.1000 mg | ORAL_TABLET | ORAL | Status: DC

## 2012-07-29 MED ORDER — LORAZEPAM 1 MG PO TABS
2.0000 mg | ORAL_TABLET | Freq: Four times a day (QID) | ORAL | Status: DC | PRN
  Administered 2012-07-30: 2 mg via ORAL
  Filled 2012-07-29: qty 2

## 2012-07-29 NOTE — ED Notes (Signed)
Pt c/o postpardom depression x67months, states her baby is 13months, pt states she has been acting out doing drugs, having suicidal thoughts with a plan to drive her car off the bridge. Pt states her PCP and [facility] MD has tried multiple depression meds with no help.

## 2012-07-29 NOTE — Consult Note (Signed)
Reason for Consult:Eval for IP psychiatric Mgmt Referring Physician: Fonnie Jarvis MD  Alexa Sims is an 42 y.o. female.  HPI: Pt is a 42 y.o WF not known to [facility] presenting to [facility] ED with concerns with opiate addiction and related passive SI. Patient endorses abusing Vicodin in addition to other opiates and heroin if she can find it. Pt also endorses depressive sx to include hopelessness, helplessness, guilt, racing thoughts, mood swings and irritability. Pt has also been very anxious and seek benzodiazepines when available. Patient denies any previous IP psychiatric interventions and has been to [facility] as an OP within the last 30 days. Patient denies any SA/HI of AVH, but cannot contract for safety at this present time.  Past Medical History  Diagnosis Date  . No pertinent past medical history   . Lumbar herniated disc 2011    L5  . Depression     Past Surgical History  Procedure Laterality Date  . Cholecystectomy    . Knee surgery      partial meniscectomy right knee  . Cesarean section  06/07/2011    Procedure: CESAREAN SECTION;  Surgeon: Loney Laurence, MD;  Location: [facility];  Service: Gynecology;  Laterality: N/A;    Family History  Problem Relation Age of Onset  . Hyperlipidemia Father   . Hypertension Father   . Heart attack Father   . Rheum arthritis Sister   . Sudden death Paternal Uncle   . Hypertension Maternal Grandmother   . Diabetes Neg Hx   . Anesthesia problems Neg Hx   . Birth defects Brother     Social History:  reports that she has been smoking Cigarettes.  She has been smoking about 0.50 packs per day. She has never used smokeless tobacco. She reports that she uses illicit drugs. She reports that she does not drink alcohol.  Allergies:  Allergies  Allergen Reactions  . Aspirin     Bothers stomach  . Tramadol Nausea And Vomiting and Other (See Comments)    Blurred vision.    Medications: I have reviewed the patient's current medications.  Results for  orders placed during the hospital encounter of 07/29/12 (from the past 48 hour(s))  URINE RAPID DRUG SCREEN (HOSP PERFORMED)     Status: Abnormal   Collection Time    07/29/12  3:57 PM      Result Value Range   Opiates POSITIVE (*) NONE DETECTED   Cocaine POSITIVE (*) NONE DETECTED   Benzodiazepines POSITIVE (*) NONE DETECTED   Amphetamines POSITIVE (*) NONE DETECTED   Tetrahydrocannabinol NONE DETECTED  NONE DETECTED   Barbiturates NONE DETECTED  NONE DETECTED   Comment:            DRUG SCREEN FOR MEDICAL PURPOSES     ONLY.  IF CONFIRMATION IS NEEDED     FOR ANY PURPOSE, NOTIFY LAB     WITHIN 5 DAYS.                LOWEST DETECTABLE LIMITS     FOR URINE DRUG SCREEN     Drug Class       Cutoff (ng/mL)     Amphetamine      1000     Barbiturate      200     Benzodiazepine   200     Tricyclics       300     Opiates          300     Cocaine  300     THC              50  ACETAMINOPHEN LEVEL     Status: None   Collection Time    07/29/12  4:09 PM      Result Value Range   Acetaminophen (Tylenol), Serum <15.0  10 - 30 ug/mL   Comment:            THERAPEUTIC CONCENTRATIONS VARY     SIGNIFICANTLY. A RANGE OF 10-30     ug/mL MAY BE AN EFFECTIVE     CONCENTRATION FOR MANY PATIENTS.     HOWEVER, SOME ARE BEST TREATED     AT CONCENTRATIONS OUTSIDE THIS     RANGE.     ACETAMINOPHEN CONCENTRATIONS     >150 ug/mL AT 4 HOURS AFTER     INGESTION AND >50 ug/mL AT 12     HOURS AFTER INGESTION ARE     OFTEN ASSOCIATED WITH TOXIC     REACTIONS.  CBC     Status: Abnormal   Collection Time    07/29/12  4:09 PM      Result Value Range   WBC 5.9  4.0 - 10.5 K/uL   RBC 4.29  3.87 - 5.11 MIL/uL   Hemoglobin 12.1  12.0 - 15.0 g/dL   HCT 40.9 (*) 81.1 - 91.4 %   MCV 83.4  78.0 - 100.0 fL   MCH 28.2  26.0 - 34.0 pg   MCHC 33.8  30.0 - 36.0 g/dL   RDW 78.2  95.6 - 21.3 %   Platelets 294  150 - 400 K/uL  COMPREHENSIVE METABOLIC PANEL     Status: Abnormal   Collection Time     07/29/12  4:09 PM      Result Value Range   Sodium 137  135 - 145 mEq/L   Potassium 4.0  3.5 - 5.1 mEq/L   Chloride 102  96 - 112 mEq/L   CO2 26  19 - 32 mEq/L   Glucose, Bld 95  70 - 99 mg/dL   BUN 12  6 - 23 mg/dL   Creatinine, Ser 0.86  0.50 - 1.10 mg/dL   Calcium 9.4  8.4 - 57.8 mg/dL   Total Protein 7.1  6.0 - 8.3 g/dL   Albumin 3.3 (*) 3.5 - 5.2 g/dL   AST 33  0 - 37 U/L   ALT 52 (*) 0 - 35 U/L   Alkaline Phosphatase 69  39 - 117 U/L   Total Bilirubin 0.2 (*) 0.3 - 1.2 mg/dL   GFR calc non Af Amer >90  >90 mL/min   GFR calc Af Amer >90  >90 mL/min   Comment:            The eGFR has been calculated     using the CKD EPI equation.     This calculation has not been     validated in all clinical     situations.     eGFR's persistently     <90 mL/min signify     possible Chronic Kidney Disease.  ETHANOL     Status: None   Collection Time    07/29/12  4:09 PM      Result Value Range   Alcohol, Ethyl (B) <11  0 - 11 mg/dL   Comment:            LOWEST DETECTABLE LIMIT FOR     SERUM ALCOHOL IS 11 mg/dL  FOR MEDICAL PURPOSES ONLY  SALICYLATE LEVEL     Status: Abnormal   Collection Time    07/29/12  4:09 PM      Result Value Range   Salicylate Lvl <2.0 (*) 2.8 - 20.0 mg/dL  POCT PREGNANCY, URINE     Status: Abnormal   Collection Time    07/29/12  4:16 PM      Result Value Range   Preg Test, Ur POSITIVE (*) NEGATIVE   Comment:            THE SENSITIVITY OF THIS     METHODOLOGY IS >24 mIU/mL  HCG, QUANTITATIVE, PREGNANCY     Status: Abnormal   Collection Time    07/29/12  6:00 PM      Result Value Range   hCG, Beta Chain, Quant, S 4170 (*) <5 mIU/mL   Comment:              GEST. AGE      CONC.  (mIU/mL)       <=1 WEEK        5 - 50         2 WEEKS       50 - 500         3 WEEKS       100 - 10,000         4 WEEKS     1,000 - 30,000         5 WEEKS     3,500 - 115,000       6-8 WEEKS     12,000 - 270,000        12 WEEKS     15,000 - 220,000                 FEMALE AND NON-PREGNANT FEMALE:         LESS THAN 5 mIU/mL  ABO/RH     Status: None   Collection Time    07/29/12  6:00 PM      Result Value Range   ABO/RH(D) A POS    WET PREP, GENITAL     Status: Abnormal   Collection Time    07/29/12  7:54 PM      Result Value Range   Yeast Wet Prep HPF POC NONE SEEN  NONE SEEN   Trich, Wet Prep NONE SEEN  NONE SEEN   Clue Cells Wet Prep HPF POC MANY (*) NONE SEEN   WBC, Wet Prep HPF POC FEW (*) NONE SEEN    Ct Head Wo Contrast  07/28/2012   *RADIOLOGY REPORT*  Clinical Data:  Fall.  Trauma to right forehead and eye.  Pain.  CT HEAD AND ORBITS WITHOUT CONTRAST  Technique:  Contiguous axial images were obtained from the base of the skull through the vertex without contrast. Multidetector CT imaging of the orbits was performed using the standard protocol without intravenous contrast.  Comparison:  CT head without contrast 07/18/2012.  CT HEAD  Findings: No acute cortical infarct, hemorrhage, or mass lesion is present.  The ventricles are of normal size.  No significant extra- axial fluid collection is present.  The visualized paranasal sinuses and mastoid air cells are clear. The osseous skull is intact.  IMPRESSION: Negative CT of the head.  CT ORBITS  Findings: Of minimal superolateral soft tissue swelling is noted along the right orbital rim.  There is no underlying fracture.  The globe is intact.  The orbit is unremarkable.  The visualized paranasal  sinuses are clear.  The nasal bones are intact.  IMPRESSION: Minimal superolateral soft tissue swelling adjacent to the right orbit without an underlying fracture or globe injury.   Original Report Authenticated By: Marin Roberts, M.D.   US Ob Comp Less 14 Wks  07/29/2012   *RADIOLOGY REPORT*  Clinical Data: Elective abortion 3 weeks ago, pain, vaginal bleeding  OBSTETRIC <14 WK [place] AND TRANSVAGINAL OB US  Technique:  Both transabdominal and transvaginal ultrasound examinations were performed for complete  evaluation of the gestation as well as the maternal uterus, adnexal regions, and pelvic cul-de-sac.  Transvaginal technique was performed to assess early pregnancy.  Comparison:  None.  Intrauterine gestational sac:  Not visualized  Maternal uterus/adnexae: Uterus measures 9.1 x 5.8 x 6.5 cm.  Enlarged, heterogeneous endometrial complex, measuring 22 mm. Associated color Doppler flow.  Right ovary is within normal limits, measuring 2.4 x 2.3 x 2.1 cm.  Left ovary is within normal limits (only visualized transabdominally), measuring 3.8 x 1.8 x 2.2 cm.  No free fluid.  IMPRESSION: Heterogeneous/thickened endometrial complex, measuring 22 mm, with associated color Doppler flow.  Given the clinical history, this appearance is worrisome for retained products of conception.   Original Report Authenticated By: Charline Bills, M.D.   US Ob Transvaginal  07/29/2012   *RADIOLOGY REPORT*  Clinical Data: Elective abortion 3 weeks ago, pain, vaginal bleeding  OBSTETRIC <14 WK [place] AND TRANSVAGINAL OB US  Technique:  Both transabdominal and transvaginal ultrasound examinations were performed for complete evaluation of the gestation as well as the maternal uterus, adnexal regions, and pelvic cul-de-sac.  Transvaginal technique was performed to assess early pregnancy.  Comparison:  None.  Intrauterine gestational sac:  Not visualized  Maternal uterus/adnexae: Uterus measures 9.1 x 5.8 x 6.5 cm.  Enlarged, heterogeneous endometrial complex, measuring 22 mm. Associated color Doppler flow.  Right ovary is within normal limits, measuring 2.4 x 2.3 x 2.1 cm.  Left ovary is within normal limits (only visualized transabdominally), measuring 3.8 x 1.8 x 2.2 cm.  No free fluid.  IMPRESSION: Heterogeneous/thickened endometrial complex, measuring 22 mm, with associated color Doppler flow.  Given the clinical history, this appearance is worrisome for retained products of conception.   Original Report Authenticated By: Charline Bills, M.D.    Ct Orbitss W/o Cm  07/28/2012   *RADIOLOGY REPORT*  Clinical Data:  Fall.  Trauma to right forehead and eye.  Pain.  CT HEAD AND ORBITS WITHOUT CONTRAST  Technique:  Contiguous axial images were obtained from the base of the skull through the vertex without contrast. Multidetector CT imaging of the orbits was performed using the standard protocol without intravenous contrast.  Comparison:  CT head without contrast 07/18/2012.  CT HEAD  Findings: No acute cortical infarct, hemorrhage, or mass lesion is present.  The ventricles are of normal size.  No significant extra- axial fluid collection is present.  The visualized paranasal sinuses and mastoid air cells are clear. The osseous skull is intact.  IMPRESSION: Negative CT of the head.  CT ORBITS  Findings: Of minimal superolateral soft tissue swelling is noted along the right orbital rim.  There is no underlying fracture.  The globe is intact.  The orbit is unremarkable.  The visualized paranasal sinuses are clear.  The nasal bones are intact.  IMPRESSION: Minimal superolateral soft tissue swelling adjacent to the right orbit without an underlying fracture or globe injury.   Original Report Authenticated By: Marin Roberts, M.D.    Review of Systems  Genitourinary:  Pt with retained fetal products and vaginal bleeding  Psychiatric/Behavioral: Positive for depression, suicidal ideas and substance abuse. Negative for hallucinations and memory loss. The patient is nervous/anxious and has insomnia.        Patient endorses passive SI but denies SA/HI or AVH at this time. The patient cannot contract for safety  All other systems reviewed and are negative.   Blood pressure 121/65, pulse 86, temperature 98.3 F (36.8 C), temperature source Oral, resp. rate 16, last menstrual period 07/28/2012, SpO2 100.00%. Physical Exam  Constitutional: She appears well-developed and well-nourished.  HENT:  Head: Normocephalic and atraumatic.  Eyes: Pupils are  equal, round, and reactive to light.  Neck: Normal range of motion. Neck supple.  Cardiovascular: Normal rate and regular rhythm.   Respiratory: Effort normal and breath sounds normal.  GI: Soft. Bowel sounds are normal.  Skin: Skin is dry.  Psychiatric:  Irritable, angry and wide array of moods, with good eye contact and overall poor insight. Cannot contract for safety and endorses passive SI in setting of opiate dependence    Assessment/Plan: 1) Accepted to [facility] hall pending bed, after scheduled DC&E per OB/GYN for crises mgmt, safety and stabilization of mood d/o and opiate addiction 2) Mgmt of co-morbid conditions 3) Intensive IP psychotherapy and psychotropic Rx 4) Social work to aid in OP support services to decrease chance of relapse and recurrent readmissions.  SIMON,SPENCER E 07/29/2012, 11:11 PM   07/30/2012  Ms Timothy is still expressing suicidal ideation and cannot contract for safety.  She is ok with having the D&C today she says.  She will need detox for opiates as well. I agree with the plan to admit to [facility] or other facility if need be for treatment for depression and SI with concurrent detox for opioids.

## 2012-07-29 NOTE — ED Provider Notes (Signed)
History    CSN: [id number] Arrival date & time 07/29/12  1519  First MD Initiated Contact with Patient 07/29/12 1545     Chief Complaint  Patient presents with  . Medical Clearance  . Suicidal   (Consider location/radiation/quality/duration/timing/severity/associated sxs/prior Treatment) The history is provided by the patient and medical records.   Presents to the ED for medical clearance- SI with plan and detox. 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 Patient states she has tried several different medications for depression without improvement. States she has recently started abusing prescription nacrcotics to cope with her feelings. She is lying, stealing, and acting out in any way possible to obtain these drugs. Last use yesterday.  Pt notes she has been using narcotic drugs for years-- started on them at age 20 for chronic back pain and herniated discs but states "my use lately is at a whole other level."  Husband has has kicked her out of their home for fear of his safety and their children so pt has been living on the street.  States she's had recurrent thoughts of killing herself by jumping off a bridge, but she stopped herself when she thinks of her children-- 3 children at home.  Denies illicit drug or EtOH use.  Is desiring inpatient detox.  No prior detox, no hx of DTs.  Patient previously evaluated by [facility] but states she was only given a prescription medications and nothing else.  Medical records reviewed-- pt was seen yesterday for head trauma after a fall.  Husband refused to come pick up pt unless she would be going to rehab for tx of her heroin abuse.  Pt denied any illicit drug use and refused help at that time.  Past Medical History  Diagnosis Date  . No pertinent past medical history   . Lumbar herniated disc 2011    L5  . Depression    Past Surgical History  Procedure  Laterality Date  . Cholecystectomy    . Knee surgery      partial meniscectomy right knee  . Cesarean section  06/07/2011    Procedure: CESAREAN SECTION;  Surgeon: Loney Laurence, MD;  Location: [facility];  Service: Gynecology;  Laterality: N/A;   Family History  Problem Relation Age of Onset  . Hyperlipidemia Father   . Hypertension Father   . Heart attack Father   . Rheum arthritis Sister   . Sudden death Paternal Uncle   . Hypertension Maternal Grandmother   . Diabetes Neg Hx   . Anesthesia problems Neg Hx   . Birth defects Brother    History  Substance Use Topics  . Smoking status: Current Every Day Smoker -- 0.50 packs/day    Types: Cigarettes  . Smokeless tobacco: Never Used  . Alcohol Use: No   OB History   Grav Para Term Preterm Abortions TAB SAB Ect Mult Living   3 3 3  0 0 0 0 0 0 3     Review of Systems  Psychiatric/Behavioral: Positive for suicidal ideas.       Detox  All other systems reviewed and are negative.    Allergies  Aspirin and Tramadol  Home Medications   Current Outpatient Rx  Name  Route  Sig  Dispense  Refill  . albuterol (PROVENTIL HFA;VENTOLIN HFA) 108 (90 BASE) MCG/ACT inhaler   Inhalation   Inhale 2 puffs into the lungs every  4 (four) hours as needed for wheezing.   1 Inhaler   0   . ALPRAZolam (XANAX) 0.5 MG tablet   Oral   Take 0.5 mg by mouth at bedtime as needed for sleep.         Marland Kitchen amoxicillin (AMOXIL) 500 MG capsule   Oral   Take 1 capsule (500 mg total) by mouth 3 (three) times daily.   21 capsule   0   . amphetamine-dextroamphetamine (ADDERALL) 10 MG tablet   Oral   Take 10 mg by mouth daily.         Marland Kitchen azithromycin (ZITHROMAX Z-PAK) 250 MG tablet      One tablet a day days 2-5   4 tablet   0   . cyclobenzaprine (FLEXERIL) 5 MG tablet   Oral   Take 1 tablet (5 mg total) by mouth 3 (three) times daily as needed for muscle spasms.   30 tablet   0   . HYDROcodone-acetaminophen (NORCO) 10-325 MG per  tablet   Oral   Take 1 tablet by mouth every 6 (six) hours as needed.         Marland Kitchen HYDROcodone-acetaminophen (NORCO) 5-325 MG per tablet   Oral   Take 1 tablet by mouth every 6 (six) hours as needed for pain.   15 tablet   0   . HYDROcodone-acetaminophen (NORCO/VICODIN) 5-325 MG per tablet   Oral   Take 2 tablets by mouth every 4 (four) hours as needed for pain.   10 tablet   0   . HYDROcodone-acetaminophen (NORCO/VICODIN) 5-325 MG per tablet   Oral   Take 2 tablets by mouth every 4 (four) hours as needed for pain.   10 tablet   0   . ibuprofen (ADVIL,MOTRIN) 200 MG tablet   Oral   Take 400 mg by mouth every 8 (eight) hours as needed. For pain.         Marland Kitchen ibuprofen (ADVIL,MOTRIN) 600 MG tablet   Oral   Take 1 tablet (600 mg total) by mouth every 8 (eight) hours as needed for pain.   30 tablet   0   . Multiple Vitamin (MULTIVITAMIN WITH MINERALS) TABS   Oral   Take 1 tablet by mouth daily.         . ondansetron (ZOFRAN) 4 MG tablet   Oral   Take 1 tablet (4 mg total) by mouth every 8 (eight) hours as needed for nausea.   5 tablet   0   . Probiotic Product (PROBIOTIC PO)   Oral   Take 1 capsule by mouth daily.         Marland Kitchen tobramycin (TOBREX) 0.3 % ophthalmic solution   Right Eye   Place 1 drop into the right eye every 4 (four) hours.   5 mL   0   . venlafaxine XR (EFFEXOR-XR) 150 MG 24 hr capsule   Oral   Take 150 mg by mouth daily.          BP 121/65  Pulse 86  Temp(Src) 98.3 F (36.8 C) (Oral)  Resp 16  SpO2 100%  LMP 07/28/2012  Physical Exam  Nursing note and vitals reviewed. Constitutional: She is oriented to person, place, and time. She appears well-developed and well-nourished. No distress.  HENT:  Head: Normocephalic and atraumatic.  Mouth/Throat: Oropharynx is clear and moist.  Eyes: Conjunctivae and EOM are normal. Pupils are equal, round, and reactive to light.  Neck: Normal range of motion. Neck supple.  Cardiovascular: Normal  rate, regular rhythm and normal heart sounds.   Pulmonary/Chest: Effort normal and breath sounds normal. No respiratory distress. She has no wheezes.  Abdominal: Soft. Bowel sounds are normal. There is no tenderness. There is no guarding.  Genitourinary: There is no tenderness or lesion on the right labia. There is no tenderness or lesion on the left labia. Cervix exhibits no motion tenderness and no discharge. Right adnexum displays tenderness. Left adnexum displays tenderness. There is bleeding around the vagina.  Cervical os closed, mild vaginal bleeding, no pus or discharge, bilateral adnexal tenderness L > R, no CMT  Musculoskeletal: Normal range of motion. She exhibits no edema.  Neurological: She is alert and oriented to person, place, and time.  Skin: Skin is warm and dry.  Psychiatric: Her speech is normal and behavior is normal. Her mood appears anxious. She exhibits a depressed mood. She expresses suicidal ideation. She expresses no homicidal ideation. She expresses suicidal plans. She expresses no homicidal plans.  Tearful, SI with plan, denies HI or AVH    ED Course  Procedures (including critical care time)  Labs Reviewed  CBC - Abnormal; Notable for the following:    HCT 35.8 (*)    All other components within normal limits  COMPREHENSIVE METABOLIC PANEL - Abnormal; Notable for the following:    Albumin 3.3 (*)    ALT 52 (*)    Total Bilirubin 0.2 (*)    All other components within normal limits  SALICYLATE LEVEL - Abnormal; Notable for the following:    Salicylate Lvl <2.0 (*)    All other components within normal limits  URINE RAPID DRUG SCREEN (HOSP PERFORMED) - Abnormal; Notable for the following:    Opiates POSITIVE (*)    Cocaine POSITIVE (*)    Benzodiazepines POSITIVE (*)    Amphetamines POSITIVE (*)    All other components within normal limits  HCG, QUANTITATIVE, PREGNANCY - Abnormal; Notable for the following:    hCG, Beta Chain, Quant, S 4170 (*)    All  other components within normal limits  POCT PREGNANCY, URINE - Abnormal; Notable for the following:    Preg Test, Ur POSITIVE (*)    All other components within normal limits  GC/CHLAMYDIA PROBE AMP  WET PREP, GENITAL  ACETAMINOPHEN LEVEL  ETHANOL  ABO/RH     US Ob Comp Less 14 Wks  07/29/2012   *RADIOLOGY REPORT*  Clinical Data: Elective abortion 3 weeks ago, pain, vaginal bleeding  OBSTETRIC <14 WK [place] AND TRANSVAGINAL OB US  Technique:  Both transabdominal and transvaginal ultrasound examinations were performed for complete evaluation of the gestation as well as the maternal uterus, adnexal regions, and pelvic cul-de-sac.  Transvaginal technique was performed to assess early pregnancy.  Comparison:  None.  Intrauterine gestational sac:  Not visualized  Maternal uterus/adnexae: Uterus measures 9.1 x 5.8 x 6.5 cm.  Enlarged, heterogeneous endometrial complex, measuring 22 mm. Associated color Doppler flow.  Right ovary is within normal limits, measuring 2.4 x 2.3 x 2.1 cm.  Left ovary is within normal limits (only visualized transabdominally), measuring 3.8 x 1.8 x 2.2 cm.  No free fluid.  IMPRESSION: Heterogeneous/thickened endometrial complex, measuring 22 mm, with associated color Doppler flow.  Given the clinical history, this appearance is worrisome for retained products of conception.   Original Report Authenticated By: Charline Bills, M.D.   US Ob Transvaginal  07/29/2012   *RADIOLOGY REPORT*  Clinical Data: Elective abortion 3 weeks ago, pain, vaginal bleeding  OBSTETRIC <14  WK [place] AND TRANSVAGINAL OB US  Technique:  Both transabdominal and transvaginal ultrasound examinations were performed for complete evaluation of the gestation as well as the maternal uterus, adnexal regions, and pelvic cul-de-sac.  Transvaginal technique was performed to assess early pregnancy.  Comparison:  None.  Intrauterine gestational sac:  Not visualized  Maternal uterus/adnexae: Uterus measures 9.1 x 5.8 x 6.5 cm.   Enlarged, heterogeneous endometrial complex, measuring 22 mm. Associated color Doppler flow.  Right ovary is within normal limits, measuring 2.4 x 2.3 x 2.1 cm.  Left ovary is within normal limits (only visualized transabdominally), measuring 3.8 x 1.8 x 2.2 cm.  No free fluid.  IMPRESSION: Heterogeneous/thickened endometrial complex, measuring 22 mm, with associated color Doppler flow.  Given the clinical history, this appearance is worrisome for retained products of conception.   Original Report Authenticated By: Charline Bills, M.D.      1. Opiate dependence   2. Depression, postpartum     MDM   u-preg + today, quant 4170. A and a and a and and Pt is G4P3- had elective abortion approx 3 weeks ago via D&C.  OB-GYN is Dr. Henderson Cloud.  Has been having mild, intermittent abdominal pain the last few days.   U/s as above- suspicious for retained products of conception.  Rh +.  OB consulted- they will try to schedule her for [facility] tomorrow at [facility] or [facility].  Pt evaluated by psych- awaiting placement after GYN procedure.  Garlon Hatchet, PA-C 07/30/12 0023

## 2012-07-29 NOTE — ED Provider Notes (Signed)
Chronic pain and chronic polysubstance abuse for over 20 years, anxiety depression for over a year, suicidal ideation with plan to drive across the bridge off-and-on for several days, separated from her husband for several days, had elective abortion a few weeks ago, states she is on her menses with vaginal bleeding today, had some intermittent a few minutes of abdominal pain in the pelvic area a few times yesterday and today but not currently, she said no nausea vomiting diarrhea no chest pain cough shortness of breath lightheadedness, she is suicidal and requesting assistance for both her depression as well as her substance abuse wanting detox, denies IV drug abuse, states she has been staying at a hotel where she tripped over a garbage can and hit her head resulting in ED visit yesterday with unremarkable CT scans, she denies domestic violence. 1645 Pt aware of positive POCT HCG and need for [place], quant HCG, abdomen currently soft and nontender. 1645 Pt is G4P3Ab1 elective Ab 3 weeks ago at [redacted] weeks EGA Rh positive with vag bleeding with intermittent tissue passage since abortion Gyn paged and Pt aware of US/HCG. 1935 D/w Gyn, Pt aware does not need emergent procedure to evacuate retained POC contents, but Gyn will check OR schedule at [facility] and/or [facility] to plan procedure realizing Pt also awaiting Psych eval and placement. 1950 Pt seen by psychiatry, anticipate [facility] admit after Gyn procedure (perhaps tomorrow?). 2330   Hurman Horn, MD 07/30/12 1228

## 2012-07-29 NOTE — BH Assessment (Signed)
Assessment Note   Alexa Sims is an 42 y.o. female.   Pt reports wanting to die. "I just don't care if I live."  Pt has plan and intent to jump off bridge or run into traffic.  There was even discussion of driving into a tree.  Pt also uses opiates daily.  Pt prefers pills of any kind.  Pt can't be specific on the amount or type of pills.  "I don't care what they are.  I just get them and take them."  "I don't how much or what."    Pt denies HI and AVH.  Pt is irritable but cooperative but her statements and responses to questions are closed and there is not a lot of elaboration to her remarks.  Pt reports she is also struggling with post pardem depression.  The baby is 59 mths old.  She reports a 8 mth struggle.  Pt also just had an abortion.  Apparently there are some medical issues concerning the abortion.  See medical.  MSE by ACT:  Pt Ox2, fair eye contact, speech clear but soft, affect flat, pt appears withdrawn, disheveled in appearance, smell of urine  Recommendation:  Pt needs to clear medically.  Uncertain if pt will go to [facility] or stay on Acute side of [facility].  Pt appears appropriate for [facility] review.  PA will make that determination.  ACT recommends BHH consideration once cleared medically.  Axis I: Major Depression, Recurrent severe, Psychosis, Post-partum and Substance Induced Mood Disorder; Opiate abuse Axis II: Deferred Axis III:  Past Medical History  Diagnosis Date  . No pertinent past medical history   . Lumbar herniated disc 2011    L5  . Depression    Axis IV: economic problems, housing problems, other psychosocial or environmental problems, problems related to legal system/crime, problems related to social environment and problems with primary support group Axis V: 41-50 serious symptoms  Past Medical History:  Past Medical History  Diagnosis Date  . No pertinent past medical history   . Lumbar herniated disc 2011    L5  . Depression     Past Surgical History   Procedure Laterality Date  . Cholecystectomy    . Knee surgery      partial meniscectomy right knee  . Cesarean section  06/07/2011    Procedure: CESAREAN SECTION;  Surgeon: Loney Laurence, MD;  Location: [facility] ORS;  Service: Gynecology;  Laterality: N/A;    Family History:  Family History  Problem Relation Age of Onset  . Hyperlipidemia Father   . Hypertension Father   . Heart attack Father   . Rheum arthritis Sister   . Sudden death Paternal Uncle   . Hypertension Maternal Grandmother   . Diabetes Neg Hx   . Anesthesia problems Neg Hx   . Birth defects Brother     Social History:  reports that she has been smoking Cigarettes.  She has been smoking about 0.50 packs per day. She has never used smokeless tobacco. She reports that she uses illicit drugs. She reports that she does not drink alcohol.  Additional Social History:  Alcohol / Drug Use Pain Medications: na Prescriptions: na Over the Counter: na History of alcohol / drug use?: Yes Substance #2 Name of Substance 2: opiates 2 - Age of First Use: lates 20s 2 - Amount (size/oz): varies 2 - Frequency: daily 2 - Duration: years 2 - Last Use / Amount: 07-29-12  CIWA: CIWA-Ar BP: 121/65 mmHg Pulse Rate: 86 COWS:  Clinical Opiate Withdrawal Scale (COWS) Resting Pulse Rate: Pulse Rate 81-100 Sweating: No report of chills or flushing Restlessness: Able to sit still Pupil Size: Pupils pinned or normal size for room light Bone or Joint Aches: Mild diffuse discomfort Runny Nose or Tearing: Not present GI Upset: Stomach cramps Tremor: No tremor Yawning: No yawning Anxiety or Irritability: Patient obviously irritable/anxious Gooseflesh Skin: Skin is smooth COWS Total Score: 5  Allergies:  Allergies  Allergen Reactions  . Aspirin     Bothers stomach  . Tramadol Nausea And Vomiting and Other (See Comments)    Blurred vision.    Home Medications:  (Not in a hospital admission)  OB/GYN Status:  Patient's last  menstrual period was 07/28/2012.  General Assessment Data Location of Assessment: [facility] Living Arrangements: Alone;Other (Comment) (kicked out of home by husband and homeless) Can pt return to current living arrangement?: Yes Admission Status: Voluntary Is patient capable of signing voluntary admission?: Yes Transfer from: Acute Hospital Referral Source: MD  Education Status Is patient currently in school?: No  Risk to self Suicidal Ideation: Yes-Currently Present Suicidal Intent: Yes-Currently Present Is patient at risk for suicide?: Yes Suicidal Plan?: Yes-Currently Present Specify Current Suicidal Plan: jump off bridge, walk into traffic,  Access to Means: Yes Specify Access to Suicidal Means: can walk What has been your use of drugs/alcohol within the last 12 months?: yes Previous Attempts/Gestures: Yes How many times?: 3 Other Self Harm Risks: cut marks on arm Triggers for Past Attempts: Unpredictable;Family contact;Other (Comment) (SA issues; postpardem depression, recent abortion) Intentional Self Injurious Behavior: Cutting Comment - Self Injurious Behavior: arms have marks, superficial Family Suicide History: No Recent stressful life event(s): Conflict (Comment);Financial Problems;Job Loss;Legal Issues;Other (Comment) (SA issues and marital issues) Persecutory voices/beliefs?: No Depression: Yes Depression Symptoms: Despondent;Insomnia;Tearfulness;Isolating;Fatigue;Guilt;Loss of interest in usual pleasures;Feeling worthless/self pity;Feeling angry/irritable Substance abuse history and/or treatment for substance abuse?: Yes Suicide prevention information given to non-admitted patients: Not applicable  Risk to Others Homicidal Ideation: No Thoughts of Harm to Others: No Current Homicidal Intent: No Current Homicidal Plan: No Access to Homicidal Means: No Identified Victim: na History of harm to others?: No Assessment of Violence: None Noted Violent Behavior  Description: cooperative but irritable Does patient have access to weapons?: No Criminal Charges Pending?: Yes Describe Pending Criminal Charges: drug related Does patient have a court date: Yes Court Date: 08/27/12  Psychosis Hallucinations: None noted Delusions: None noted  Mental Status Report Appear/Hygiene: Disheveled Eye Contact: Fair Motor Activity: Unremarkable Speech: Logical/coherent;Soft Level of Consciousness: Quiet/awake;Irritable Mood: Depressed;Anxious;Ashamed/humiliated;Sad;Worthless, low self-esteem;Irritable Affect: Anxious;Depressed;Irritable;Preoccupied Anxiety Level: Moderate Thought Processes: Coherent Judgement: Impaired Orientation: Person;Place;Situation Obsessive Compulsive Thoughts/Behaviors: None  Cognitive Functioning Concentration: Decreased Memory: Recent Intact;Remote Intact IQ: Average Insight: Poor Impulse Control: Poor Appetite: Poor Weight Loss: 10 Weight Gain: 0 Sleep: Decreased Total Hours of Sleep: 3 Vegetative Symptoms: None  ADLScreening [facility] Assessment Services) Patient's cognitive ability adequate to safely complete daily activities?: Yes Patient able to express need for assistance with ADLs?: Yes Independently performs ADLs?: Yes (appropriate for developmental age)  Abuse/Neglect [facility]) Physical Abuse: Denies (pt denied but appears unlikely based on hx) Verbal Abuse: Yes, past (Comment) (pt vague about hx) Sexual Abuse: Denies (pt denied but appears unlikely based on hx)  Prior Inpatient Therapy Prior Inpatient Therapy: No Prior Therapy Dates: na Prior Therapy Facilty/Provider(s): na Reason for Treatment: na  Prior Outpatient Therapy Prior Outpatient Therapy: No Prior Therapy Dates: na Prior Therapy Facilty/Provider(s): na Reason for Treatment: na  ADL Screening (condition at time  of admission) Patient's cognitive ability adequate to safely complete daily activities?: Yes Is the patient deaf or have difficulty  hearing?: Yes Does the patient have difficulty seeing, even when wearing glasses/contacts?: Yes Does the patient have difficulty concentrating, remembering, or making decisions?: Yes Patient able to express need for assistance with ADLs?: Yes Does the patient have difficulty dressing or bathing?: Yes Independently performs ADLs?: Yes (appropriate for developmental age) Does the patient have difficulty walking or climbing stairs?: Yes Weakness of Legs: None Weakness of Arms/Hands: None  Home Assistive Devices/Equipment Home Assistive Devices/Equipment: None  Therapy Consults (therapy consults require a physician order) PT Evaluation Needed: No OT Evalulation Needed: No SLP Evaluation Needed: No Abuse/Neglect Assessment (Assessment to be complete while patient is alone) Physical Abuse: Denies (pt denied but appears unlikely based on hx) Verbal Abuse: Yes, past (Comment) (pt vague about hx) Sexual Abuse: Denies (pt denied but appears unlikely based on hx) Exploitation of patient/patient's resources: Denies Self-Neglect: Denies Values / Beliefs Cultural Requests During Hospitalization: None Spiritual Requests During Hospitalization: None Consults Spiritual Care Consult Needed: No Social Work Consult Needed: No Merchant navy officer (For Healthcare) Advance Directive: Patient does not have advance directive Pre-existing out of facility DNR order (yellow form or pink MOST form): No    Additional Information 1:1 In Past 12 Months?: No CIRT Risk: No Elopement Risk: No Does patient have medical clearance?: No     Disposition:  Disposition Initial Assessment Completed for this Encounter: Yes Disposition of Patient: Inpatient treatment program Type of inpatient treatment program: Adult  On Site Evaluation by:   Reviewed with Physician:     Macon Large 07/29/2012 10:36 PM

## 2012-07-29 NOTE — ED Provider Notes (Signed)
History/physical exam/procedure(s) were performed by non-physician practitioner and as supervising physician I was immediately available for consultation/collaboration. I have reviewed all notes and am in agreement with care and plan.   Alexa Sims S Jaxten Brosh, MD 07/29/12 2123 

## 2012-07-30 ENCOUNTER — Inpatient Hospital Stay (HOSPITAL_COMMUNITY)
Admission: AD | Admit: 2012-07-30 | Discharge: 2012-08-04 | DRG: 897 | Disposition: A | Discharge status: Home / Self Care | Payer: No Typology Code available for payment source | Source: Intra-hospital | Attending: Psychiatry | Admitting: Psychiatry

## 2012-07-30 ENCOUNTER — Encounter (HOSPITAL_COMMUNITY): Payer: Self-pay | Admitting: *Deleted

## 2012-07-30 DIAGNOSIS — F1120 Opioid dependence, uncomplicated: Secondary | ICD-10-CM

## 2012-07-30 DIAGNOSIS — F1920 Other psychoactive substance dependence, uncomplicated: Principal | ICD-10-CM | POA: Diagnosis present

## 2012-07-30 DIAGNOSIS — F17200 Nicotine dependence, unspecified, uncomplicated: Secondary | ICD-10-CM | POA: Diagnosis present

## 2012-07-30 DIAGNOSIS — F331 Major depressive disorder, recurrent, moderate: Secondary | ICD-10-CM | POA: Diagnosis present

## 2012-07-30 DIAGNOSIS — Z79899 Other long term (current) drug therapy: Secondary | ICD-10-CM

## 2012-07-30 DIAGNOSIS — F411 Generalized anxiety disorder: Secondary | ICD-10-CM | POA: Diagnosis present

## 2012-07-30 DIAGNOSIS — F11220 Opioid dependence with intoxication, uncomplicated: Secondary | ICD-10-CM

## 2012-07-30 HISTORY — DX: Anxiety disorder, unspecified: F41.9

## 2012-07-30 LAB — URINE CULTURE

## 2012-07-30 LAB — GC/CHLAMYDIA PROBE AMP: GC Probe RNA: NEGATIVE

## 2012-07-30 MED ORDER — METHOCARBAMOL 500 MG PO TABS
500.0000 mg | ORAL_TABLET | Freq: Three times a day (TID) | ORAL | Status: DC | PRN
  Administered 2012-07-30 – 2012-08-04 (×10): 500 mg via ORAL
  Filled 2012-07-30 (×10): qty 1

## 2012-07-30 MED ORDER — CLONIDINE HCL 0.1 MG PO TABS
0.1000 mg | ORAL_TABLET | Freq: Every day | ORAL | Status: DC
  Administered 2012-08-04: 0.1 mg via ORAL
  Filled 2012-07-30 (×2): qty 1

## 2012-07-30 MED ORDER — LORAZEPAM 1 MG PO TABS
1.0000 mg | ORAL_TABLET | Freq: Three times a day (TID) | ORAL | Status: DC | PRN
  Administered 2012-07-30 – 2012-07-31 (×2): 1 mg via ORAL
  Filled 2012-07-30 (×3): qty 1

## 2012-07-30 MED ORDER — CLONIDINE HCL 0.1 MG PO TABS
0.1000 mg | ORAL_TABLET | ORAL | Status: AC
  Administered 2012-08-02 – 2012-08-03 (×4): 0.1 mg via ORAL
  Filled 2012-07-30 (×4): qty 1

## 2012-07-30 MED ORDER — NICOTINE 21 MG/24HR TD PT24
21.0000 mg | MEDICATED_PATCH | Freq: Every day | TRANSDERMAL | Status: DC
  Administered 2012-07-30 – 2012-08-04 (×6): 21 mg via TRANSDERMAL
  Filled 2012-07-30 (×7): qty 1

## 2012-07-30 MED ORDER — TRAZODONE HCL 50 MG PO TABS
50.0000 mg | ORAL_TABLET | Freq: Every evening | ORAL | Status: DC | PRN
  Administered 2012-07-30 – 2012-08-03 (×5): 50 mg via ORAL
  Filled 2012-07-30 (×6): qty 1
  Filled 2012-07-30: qty 28
  Filled 2012-07-30: qty 1
  Filled 2012-07-30: qty 28
  Filled 2012-07-30 (×2): qty 1
  Filled 2012-07-30: qty 28
  Filled 2012-07-30 (×4): qty 1

## 2012-07-30 MED ORDER — ALUM & MAG HYDROXIDE-SIMETH 200-200-20 MG/5ML PO SUSP: 30.0000 mL | ORAL | Status: DC | PRN

## 2012-07-30 MED ORDER — MAGNESIUM HYDROXIDE 400 MG/5ML PO SUSP: 30.0000 mL | Freq: Every day | ORAL | Status: DC | PRN

## 2012-07-30 MED ORDER — ACETAMINOPHEN 325 MG PO TABS
650.0000 mg | ORAL_TABLET | Freq: Four times a day (QID) | ORAL | Status: DC | PRN
  Administered 2012-07-30 – 2012-08-04 (×9): 650 mg via ORAL

## 2012-07-30 MED ORDER — CLONIDINE HCL 0.1 MG PO TABS
0.1000 mg | ORAL_TABLET | Freq: Four times a day (QID) | ORAL | Status: AC
  Administered 2012-07-30 – 2012-08-01 (×10): 0.1 mg via ORAL
  Filled 2012-07-30 (×11): qty 1

## 2012-07-30 MED ORDER — DICYCLOMINE HCL 20 MG PO TABS: 20.0000 mg | ORAL_TABLET | Freq: Four times a day (QID) | ORAL | Status: DC | PRN

## 2012-07-30 MED ORDER — MISOPROSTOL 200 MCG PO TABS
600.0000 ug | ORAL_TABLET | Freq: Once | ORAL | Status: AC
  Administered 2012-07-30: 600 ug via VAGINAL
  Filled 2012-07-30: qty 3

## 2012-07-30 MED ORDER — NICOTINE 14 MG/24HR TD PT24: 14.0000 mg | MEDICATED_PATCH | Freq: Once | TRANSDERMAL | Status: DC

## 2012-07-30 NOTE — ED Notes (Signed)
Pt tolerated vaginal insertion of Cytotec without any discomfort.  Scant brown discharge on pad.

## 2012-07-30 NOTE — ED Notes (Addendum)
Spoke with Dr. Lynelle Sims- the understanding is that [facility] OR will notify [facility] ED regarding getting pt on OR schedule.  Dr.Tallehan [facility] GYN), who spoke with ED MD last night, called at [phone].  She consulted with Dr. Donovan Kail, MD in her practice and they agreed to manage pt with medication therapy at this juncture.  Dr. Elease Sims would like to order PO/bucal Mioproste/sytotec 600 mcg to pass uterine remains.  This med is one that is ordered often as an outpt medication therapy.  Per MD, RN can expect to see increased vaginal bleeding, cramping and discomfort.  If pt is soaking a pad an hour for four hours, this is too much and RN should call Dr. Elease Sims ([phone]: in surgery from 0930-1230 today) and if she's not available, please call Alexa Kail, MD in her group.  Alexa Sims is on call tonight for [facility].  If pt doesn't pass uterine remains by tomorrow, get on OR schedule tomorrow or Sat.  Dr. Elease Sims would like Dr. Lynelle Sims to order this medication.  Notified Dr. Lynelle Sims.  Addendum:  Dr. Lynelle Sims will consult with Dr. Elease Sims

## 2012-07-30 NOTE — ED Notes (Signed)
Report given Dawnaly RN at Adult Unit [facility].

## 2012-07-30 NOTE — ED Provider Notes (Signed)
Dr Elease Hashimoto called back with new recommendations. She discussed the case with her partner. They recommend 600 mcg of Cytotec orally. Patient does not need a surgical dilatation and curettage procedure.  Patient is medically stable to have her psychiatric illness treated as an inpatient at this time.  Celene Kras, MD 07/30/12 1044

## 2012-07-30 NOTE — ED Notes (Signed)
Security now available to transport pt to [facility].  Paperwork and belonging bag given to [place], [place] for transport.

## 2012-07-30 NOTE — Progress Notes (Signed)
Patient did attend the evening karaoke group. Pt was attentive and supportive.   

## 2012-07-30 NOTE — Progress Notes (Signed)
Pt confirms pcp is Dr Criss Alvine in [place]  EPIC updated

## 2012-07-30 NOTE — Consult Note (Signed)
42 y.o. presented to [facility] ED with complaints of depression and suicidal ideation.  Pt had a therapeutic abortion approximately 2 weeks ago at a center in [place].  She reports continued bleeding since then, at times heavy.  She states she has been under treatment for postpartum depression with several medications that have not helped and has been using Xanax regularly b/c its the only thing that makes her feel better.  From a gynecologic standpoint she has not other complaints.  Denies current heavy bleeding; is experiencing some cramping but no severe pain.  Denies fever or pelvic pain.  Past Medical History  Diagnosis Date  . No pertinent past medical history   . Lumbar herniated disc 2011    L5  . Depression   . Anxiety    Past Surgical History  Procedure Laterality Date  . Cholecystectomy    . Knee surgery      partial meniscectomy right knee  . Cesarean section  06/07/2011    Procedure: CESAREAN SECTION;  Surgeon: Loney Laurence, MD;  Location: [facility] ORS;  Service: Gynecology;  Laterality: N/A;    History   Social History  . Marital Status: Single    Spouse Name: N/A    Number of Children: N/A  . Years of Education: N/A   Occupational History  . Not on file.   Social History Main Topics  . Smoking status: Current Every Day Smoker -- 1.00 packs/day for 15 years    Types: Cigarettes  . Smokeless tobacco: Never Used  . Alcohol Use: No  . Drug Use: Yes    Special: Cocaine, Heroin, Hydrocodone, Benzodiazepines, Opium     Comment: opiates/benzos  . Sexually Active: Yes    Birth Control/ Protection: None, Abstinence   Other Topics Concern  . Not on file   Social History Narrative  . No narrative on file    No current facility-administered medications on file prior to encounter.   Current Outpatient Prescriptions on File Prior to Encounter  Medication Sig Dispense Refill  . HYDROcodone-acetaminophen (NORCO) 10-325 MG per tablet Take 1 tablet by mouth every 6 (six) hours  as needed for pain.       . Multiple Vitamin (MULTIVITAMIN WITH MINERALS) TABS Take 1 tablet by mouth daily.      . Vilazodone HCl (VIIBRYD) 40 MG TABS Take 40 mg by mouth daily.        Allergies  Allergen Reactions  . Aspirin     Bothers stomach  . Tramadol Nausea And Vomiting and Other (See Comments)    Blurred vision.    @VITALS2 @  Physical exam deferred, examined in ED.  A:  Suspected RPOC by [place] and elevated BHCG quant.   P:  Prior discussion with Dr. Lynelle Doctor of [facility] ED, I advised trial of buccal or oral misoprostol to promote passage of RPOC.  Pt received prior to transfer to behavioral health facility.  Advised patient that she may experience worsening cramps and a period of heavier vaginal bleeding, but that it should subside after several hours.  If not, to alert RN.  Pt states that she will be at this facility for 72hrs and anticipates discharge thereafter.  Recommend that she follow up in our office next week for re-evaluation.  Philip Aspen

## 2012-07-30 NOTE — ED Notes (Signed)
Security called to transport pt to adult [facility] around 1354.  Security with another pt at this time.  Called [facility] to update Dawnaly, RN on delay as well as to make her aware that pt is starting to have cramping as well as increased bleeding.

## 2012-07-30 NOTE — Progress Notes (Signed)
D:  42 year old Caucasian female admitted for heroin detox and suicidal ideation.  She states she has been depressed since shortly after the birth of her child 13 months ago.  She is currently separated from her husband and he does not want her to come home until after she has some treatment.  Patient wants to be here three days only.  States she started using heroin after her last child was born because she did not feel she was getting adequate help for her post partum depression.  States she has also been using Xanax and Valium on a regular basis.  She is tearful at times and then quickly becomes irritable.  Asks many questions about medications and what she will be getting to help with her detox symptoms.  She is a smoker and was started on a nicotine patch.  Patient recently had an abortion and had some retained tissue noted on ultrasound.  She was given some tablets vaginally to help her to expel the uterine contents.  This has caused her to have some cramping which is to be expected per notes from OB/GYN.  Also noted on her labs, she had a urine culture that was positive for greater than 100,000 e-coli and her wet prep was positive for clue cells.  She was cooperative with the admission process.  She was given a snack and a drink to hold her over until dinner time.   A:  Admission process completed.  Patient was started on the Clonidine protocol.  I explained the 72 hour request for discharge.  She was also educated on what medications are scheduled as well as what will be available to her on an as needed basis.  She was oriented to her room, the unit, and the 300 hall schedule.  R:  Cooperative with the admission process.  Had several questions about medications and what would be available to her.  States she will not likely be able to attend or participate in groups while she is withdrawing from the opiates.

## 2012-07-30 NOTE — ED Notes (Signed)
Pt had shirt and panties removed and wanded by security.  Belongings added to locker 32

## 2012-07-30 NOTE — Tx Team (Signed)
Initial Interdisciplinary Treatment Plan  PATIENT STRENGTHS: (choose at least two) Average or above average intelligence Capable of independent living Communication skills Financial means General fund of knowledge Physical Health  PATIENT STRESSORS: Marital or family conflict   PROBLEM LIST: Problem List/Patient Goals Date to be addressed Date deferred Reason deferred Estimated date of resolution  Get off heroin 30 Jul 2012     Help with depression 30 Jul 2012                                                DISCHARGE CRITERIA:  Ability to meet basic life and health needs Adequate post-discharge living arrangements Improved stabilization in mood, thinking, and/or behavior  PRELIMINARY DISCHARGE PLAN: Attend aftercare/continuing care group Outpatient therapy  PATIENT/FAMIILY INVOLVEMENT: This treatment plan has been presented to and reviewed with the patient, Alexa Sims, and/or family member.  The patient and family have been given the opportunity to ask questions and make suggestions.  Alexa Sims 07/30/2012, 4:20 PM

## 2012-07-30 NOTE — ED Notes (Addendum)
Called Erin in pharmacy re Misoprostol- the med is in tablet form and the route is vaginal.  Pharmacy will call [facility] to verify if Surgi-lube is okay to use with this medication.  Waiting for reply.  Per Denny Peon in pharmacy- lubricate glove with surgilube (water-based) and insert with two finger.  If bleeding is too heavy to retain tablets, may insert rectally using surgilube.

## 2012-07-30 NOTE — ED Notes (Addendum)
Per Jessie Foot ([id number]), Karleen Hampshire, PA  Completed pych eval and pt will be accepted to [facility] pending 300 hall bed and awaiting procedure at [facility] to perform POC to evacuate uterine fragments.  Consulting civil engineer notified

## 2012-07-30 NOTE — Progress Notes (Signed)
[facility] has seen patient and provided her with a oc application. °

## 2012-07-30 NOTE — BHH Counselor (Signed)
Patient accepted to [facility] by Donell Sievert, PA to Dr. Geoffery Lyons. The room assignment is 302-2. Nursing call report # is [phone].

## 2012-07-31 ENCOUNTER — Encounter (HOSPITAL_COMMUNITY): Payer: Self-pay | Admitting: Psychiatry

## 2012-07-31 DIAGNOSIS — F329 Major depressive disorder, single episode, unspecified: Secondary | ICD-10-CM

## 2012-07-31 DIAGNOSIS — O99345 Other mental disorders complicating the puerperium: Secondary | ICD-10-CM

## 2012-07-31 DIAGNOSIS — F1920 Other psychoactive substance dependence, uncomplicated: Secondary | ICD-10-CM

## 2012-07-31 DIAGNOSIS — F1120 Opioid dependence, uncomplicated: Secondary | ICD-10-CM

## 2012-07-31 DIAGNOSIS — F331 Major depressive disorder, recurrent, moderate: Secondary | ICD-10-CM

## 2012-07-31 MED ORDER — LORAZEPAM 1 MG PO TABS
1.0000 mg | ORAL_TABLET | ORAL | Status: DC | PRN
  Administered 2012-07-31 – 2012-08-03 (×9): 1 mg via ORAL
  Filled 2012-07-31 (×10): qty 1

## 2012-07-31 MED ORDER — LAMOTRIGINE 25 MG PO TABS
25.0000 mg | ORAL_TABLET | Freq: Every day | ORAL | Status: DC
  Administered 2012-07-31 – 2012-08-02 (×3): 25 mg via ORAL
  Filled 2012-07-31 (×7): qty 1

## 2012-07-31 MED ORDER — QUETIAPINE FUMARATE 25 MG PO TABS
25.0000 mg | ORAL_TABLET | Freq: Three times a day (TID) | ORAL | Status: DC | PRN
  Administered 2012-07-31 – 2012-08-03 (×11): 25 mg via ORAL
  Filled 2012-07-31 (×12): qty 1

## 2012-07-31 NOTE — Tx Team (Signed)
Interdisciplinary Treatment Plan Update (Adult)  Date: 07/31/2012   Time Reviewed: 10:38 AM  Progress in Treatment:  Attending groups: Yes Participating in groups:  Yes Taking medication as prescribed: Yes  Tolerating medication: Yes  Family/Significant othe contact made: Not yet. SPE needed for this pt.  Patient understands diagnosis: Yes, AEB seeking treatment for SI, depression, detox/opiates, and medication stabilization. Discussing patient identified problems/goals with staff: Yes  Medical problems stabilized or resolved: Yes  Denies suicidal/homicidal ideation: Passive SI. Able to contract for safety.   Patient has not harmed self or Others: Yes  New problem(s) identified: n/a  Discharge Plan or Barriers: CSW and pt are currently exploring aftercare options for pt. Pt lives in [place] and has Medicaid.  Additional comments: Alexa Sims is an 42 y.o. female.  Pt reports wanting to die. "I just don't care if I live." Pt has plan and intent to jump off bridge or run into traffic. There was even discussion of driving into a tree. Pt also uses opiates daily. Pt prefers pills of any kind. Pt can't be specific on the amount or type of pills. "I don't care what they are. I just get them and take them." "I don't how much or what."  Pt denies HI and AVH. Pt is irritable but cooperative but her statements and responses to questions are closed and there is not a lot of elaboration to her remarks. Pt reports she is also struggling with post pardem depression. The baby is 81 mths old. She reports a 8 mth struggle. Pt also just had an abortion. Apparently there are some medical issues concerning the abortion. See medical.  MSE by ACT: Pt Ox2, fair eye contact, speech clear but soft, affect flat, pt appears withdrawn, disheveled in appearance, smell of urine . Reason for Continuation of Hospitalization: SI Detox-Clonidine taper Mood stabilization Medication management Estimated length of stay: 3-5  days For review of initial/current patient goals, please see plan of care.  Attendees:  Patient:    Family:    Physician: Geoffery Lyons  07/31/2012 10:41 AM   Nursing:    Clinical Social Worker [employer], LCSWA  07/31/2012 10:41 AM   Other:    Other:    Other:    Other:    Scribe for Treatment Team:  Trula Slade LCSWA 07/31/2012 10:41 AM

## 2012-07-31 NOTE — H&P (Signed)
Psychiatric Admission Assessment Adult  Patient Identification:  Alexa Sims  Date of Evaluation:  07/31/2012  Chief Complaint:  OPIATE DEPENDENCE  History of Present Illness: This is a 42 year old Caucasian female. Admitted to [facility] from the [facility] with complaints of suicidal ideations with plans to either jump off a bridge and or run in front of an incoming traffic. Patient reports, "My husband took me to the hospital yesterday. I was having bad thoughts about killing myself. I was using street drugs. I was using anything and everything that I can get my hands on. I was taking taking these drugs to help me cope because my post-partum drug did not work or help me. I was on Viibryd after I had a baby 1 year ago. This medicine made me feel weird and do crazy stuff. I had a baby 1 year ago. 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 I was started on Viibryd for it. The viibryd made feel worse. I stopped taking it. But I will go out on the streets and buy Opiates, Benzodiazepines and cocaine. I recently lost a baby too. That was 4 & 1/2 weeks pregnancy. Again, the only way that I can deal with all these issues, maintain my job and take care of my family is to take drugs. Then, drug use is making me think of killing myself. I'm only suicidal when and or after I use drugs. I was going to [facility] for psychiatric care".  Elements:  Location:  [facility] adult unit. Quality:  Increased drug use, poor coping skills. Severity:  Severe. Timing:  "My drug use worsened 4 months ago. Duration:  "I have been abusing drugs x 1 year". Context:  Bad mood swings, increased drug use, suicidal ideation.  Associated Signs/Synptoms:  Depression Symptoms:  psychomotor agitation, difficulty concentrating, hopelessness, suicidal thoughts with specific plan, anxiety, panic attacks,  (Hypo) Manic Symptoms:  Distractibility, Elevated Mood, Impulsivity, Irritable Mood, Labiality of Mood,  Anxiety Symptoms:   Excessive Worry,  Psychotic Symptoms:  Hallucinations: Denies  PTSD Symptoms: Had a traumatic exposure:  None reported  Psychiatric Specialty Exam: Physical Exam  Constitutional: She is oriented to person, place, and time. She appears well-developed.  HENT:  Head: Normocephalic.  Eyes: Pupils are equal, round, and reactive to light.  Neck: Normal range of motion.  Cardiovascular: Normal rate.   Respiratory: Effort normal.  GI: Soft.  Musculoskeletal: Normal range of motion.  Neurological: She is alert and oriented to person, place, and time.  Skin: Skin is warm and dry.  Psychiatric: Her speech is normal and behavior is normal. Thought content normal. Her mood appears anxious (Rated at #6). Cognition and memory are normal. She expresses impulsivity and inappropriate judgment. She exhibits a depressed mood (Rated at #3).    Review of Systems  Constitutional: Negative.   HENT: Negative.   Eyes: Negative.   Respiratory: Negative.   Cardiovascular: Negative.   Gastrointestinal: Positive for nausea, abdominal pain and diarrhea.  Genitourinary: Negative.   Musculoskeletal: Positive for myalgias.  Skin: Negative.   Neurological: Positive for tremors.  Psychiatric/Behavioral: Positive for depression (Rated at #3) and substance abuse (Polysubstance abuse). Negative for suicidal ideas, hallucinations and memory loss. The patient is nervous/anxious (Rated at #6). The patient does not have insomnia.     Blood pressure 115/80, pulse 83, temperature 98.4 F (36.9 C), temperature source Oral, resp. rate 16, height 5\' 3"  (1.6 m), weight 62.143 kg (137 lb), last menstrual period 07/28/2012.Body mass index is 24.27 kg/(m^2).  General  Appearance: Fairly Groomed  Patent attorney::  Fair  Speech:  Clear and Coherent  Volume:  Normal  Mood:  Anxious and Depressed  Affect:  Restricted  Thought Process:  Coherent and Goal Directed  Orientation:  Full (Time, Place, and Person)  Thought Content:   Rumination  Suicidal Thoughts:  No, but present on admission.  Homicidal Thoughts:  No  Memory:  Immediate;   Good Recent;   Good Remote;   Good  Judgement:  Fair  Insight:  Fair  Psychomotor Activity:  Restlessness  Concentration:  Fair  Recall:  Good  Akathisia:  No  Handed:  Right  AIMS (if indicated):     Assets:  Desire for Improvement  Sleep:  Number of Hours: 6.75    Past Psychiatric History: Diagnosis: Polysubstance dependence, Post-partum depression  Hospitalizations: [facility]  Outpatient Care: [facility]  Substance Abuse Care: None  Self-Mutilation: Denies  Suicidal Attempts: Denies attempts, admits thoughts  Violent Behaviors: None reported   Past Medical History:   Past Medical History  Diagnosis Date  . No pertinent past medical history   . Lumbar herniated disc 2011    L5  . Depression   . Anxiety    None.  Allergies:   Allergies  Allergen Reactions  . Aspirin     Bothers stomach  . Tramadol Nausea And Vomiting and Other (See Comments)    Blurred vision.   PTA Medications: Prescriptions prior to admission  Medication Sig Dispense Refill  . HYDROcodone-acetaminophen (NORCO) 10-325 MG per tablet Take 1 tablet by mouth every 6 (six) hours as needed for pain.       . Multiple Vitamin (MULTIVITAMIN WITH MINERALS) TABS Take 1 tablet by mouth daily.      . Vilazodone HCl (VIIBRYD) 40 MG TABS Take 40 mg by mouth daily.        Previous Psychotropic Medications:  Medication/Dose  See medication lists               Substance Abuse History in the last 12 months:  yes  Consequences of Substance Abuse: Medical Consequences:  Liver damage, Possible death by overdose Legal Consequences:  Arrests, jail time, Loss of driving privilege. Family Consequences:  Family discord, divorce and or separation.  Social History:  reports that she has been smoking Cigarettes.  She has a 15 pack-year smoking history. She has never used smokeless tobacco. She reports that  she uses illicit drugs (Cocaine, Heroin, Hydrocodone, Benzodiazepines, and Opium). She reports that she does not drink alcohol. Additional Social History: Pain Medications: Vicodin PRN from PCP for chronic back pain History of alcohol / drug use?: Yes Negative Consequences of Use: Financial;Personal relationships Withdrawal Symptoms: Agitation;Cramps;Irritability;Nausea / Vomiting;Tremors;Other (Comment);Tachycardia (Restless legs)  Current Place of Residence: [place], [place]   Place of Birth: [place], [place]  Family Members: "My husband and 3 daughters"  Marital Status:  Married  Children: 3  Sons: 0  Daughters: 3  Relationships: Married  Education:  Acupuncturist Problems/Performance: Completed 2 year college  Religious Beliefs/Practices: NA  History of Abuse (Emotional/Phsycial/Sexual): None  Occupational Experiences: Self Corporate investment banker History:  None.  Legal History: None pending  Hobbies/Interests: None reported  Family History:   Family History  Problem Relation Age of Onset  . Hyperlipidemia Father   . Hypertension Father   . Heart attack Father   . Rheum arthritis Sister   . Sudden death Paternal Uncle   . Hypertension Maternal Grandmother   . Diabetes Neg Hx   .  Anesthesia problems Neg Hx   . Birth defects Brother    Results for orders placed during the hospital encounter of 07/29/12 (from the past 72 hour(s))  URINE RAPID DRUG SCREEN (HOSP PERFORMED)     Status: Abnormal   Collection Time    07/29/12  3:57 PM      Result Value Range   Opiates POSITIVE (*) NONE DETECTED   Cocaine POSITIVE (*) NONE DETECTED   Benzodiazepines POSITIVE (*) NONE DETECTED   Amphetamines POSITIVE (*) NONE DETECTED   Tetrahydrocannabinol NONE DETECTED  NONE DETECTED   Barbiturates NONE DETECTED  NONE DETECTED   Comment:            DRUG SCREEN FOR MEDICAL PURPOSES     ONLY.  IF CONFIRMATION IS NEEDED     FOR ANY PURPOSE, NOTIFY LAB     WITHIN 5 DAYS.                 LOWEST DETECTABLE LIMITS     FOR URINE DRUG SCREEN     Drug Class       Cutoff (ng/mL)     Amphetamine      1000     Barbiturate      200     Benzodiazepine   200     Tricyclics       300     Opiates          300     Cocaine          300     THC              50  ACETAMINOPHEN LEVEL     Status: None   Collection Time    07/29/12  4:09 PM      Result Value Range   Acetaminophen (Tylenol), Serum <15.0  10 - 30 ug/mL   Comment:            THERAPEUTIC CONCENTRATIONS VARY     SIGNIFICANTLY. A RANGE OF 10-30     ug/mL MAY BE AN EFFECTIVE     CONCENTRATION FOR MANY PATIENTS.     HOWEVER, SOME ARE BEST TREATED     AT CONCENTRATIONS OUTSIDE THIS     RANGE.     ACETAMINOPHEN CONCENTRATIONS     >150 ug/mL AT 4 HOURS AFTER     INGESTION AND >50 ug/mL AT 12     HOURS AFTER INGESTION ARE     OFTEN ASSOCIATED WITH TOXIC     REACTIONS.  CBC     Status: Abnormal   Collection Time    07/29/12  4:09 PM      Result Value Range   WBC 5.9  4.0 - 10.5 K/uL   RBC 4.29  3.87 - 5.11 MIL/uL   Hemoglobin 12.1  12.0 - 15.0 g/dL   HCT 16.1 (*) 09.6 - 04.5 %   MCV 83.4  78.0 - 100.0 fL   MCH 28.2  26.0 - 34.0 pg   MCHC 33.8  30.0 - 36.0 g/dL   RDW 40.9  81.1 - 91.4 %   Platelets 294  150 - 400 K/uL  COMPREHENSIVE METABOLIC PANEL     Status: Abnormal   Collection Time    07/29/12  4:09 PM      Result Value Range   Sodium 137  135 - 145 mEq/L   Potassium 4.0  3.5 - 5.1 mEq/L   Chloride 102  96 - 112 mEq/L   CO2 26  19 - 32 mEq/L   Glucose, Bld 95  70 - 99 mg/dL   BUN 12  6 - 23 mg/dL   Creatinine, Ser 2.95  0.50 - 1.10 mg/dL   Calcium 9.4  8.4 - 62.1 mg/dL   Total Protein 7.1  6.0 - 8.3 g/dL   Albumin 3.3 (*) 3.5 - 5.2 g/dL   AST 33  0 - 37 U/L   ALT 52 (*) 0 - 35 U/L   Alkaline Phosphatase 69  39 - 117 U/L   Total Bilirubin 0.2 (*) 0.3 - 1.2 mg/dL   GFR calc non Af Amer >90  >90 mL/min   GFR calc Af Amer >90  >90 mL/min   Comment:            The eGFR has been calculated      using the CKD EPI equation.     This calculation has not been     validated in all clinical     situations.     eGFR's persistently     <90 mL/min signify     possible Chronic Kidney Disease.  ETHANOL     Status: None   Collection Time    07/29/12  4:09 PM      Result Value Range   Alcohol, Ethyl (B) <11  0 - 11 mg/dL   Comment:            LOWEST DETECTABLE LIMIT FOR     SERUM ALCOHOL IS 11 mg/dL     FOR MEDICAL PURPOSES ONLY  SALICYLATE LEVEL     Status: Abnormal   Collection Time    07/29/12  4:09 PM      Result Value Range   Salicylate Lvl <2.0 (*) 2.8 - 20.0 mg/dL  POCT PREGNANCY, URINE     Status: Abnormal   Collection Time    07/29/12  4:16 PM      Result Value Range   Preg Test, Ur POSITIVE (*) NEGATIVE   Comment:            THE SENSITIVITY OF THIS     METHODOLOGY IS >24 mIU/mL  HCG, QUANTITATIVE, PREGNANCY     Status: Abnormal   Collection Time    07/29/12  6:00 PM      Result Value Range   hCG, Beta Chain, Quant, S 4170 (*) <5 mIU/mL   Comment:              GEST. AGE      CONC.  (mIU/mL)       <=1 WEEK        5 - 50         2 WEEKS       50 - 500         3 WEEKS       100 - 10,000         4 WEEKS     1,000 - 30,000         5 WEEKS     3,500 - 115,000       6-8 WEEKS     12,000 - 270,000        12 WEEKS     15,000 - 220,000                FEMALE AND NON-PREGNANT FEMALE:         LESS THAN 5 mIU/mL  ABO/RH     Status: None   Collection Time    07/29/12  6:00 PM      Result Value Range   ABO/RH(D) A POS    GC/CHLAMYDIA PROBE AMP     Status: None   Collection Time    07/29/12  7:54 PM      Result Value Range   CT Probe RNA NEGATIVE  NEGATIVE   GC Probe RNA NEGATIVE  NEGATIVE   Comment: (NOTE)                                                                                              Normal Reference Range: Negative          Assay performed using the Gen-Probe APTIMA COMBO2 (R) Assay.     Acceptable specimen types for this assay include APTIMA Swabs  (Unisex,     endocervical, urethral, or vaginal), first void urine, and ThinPrep     liquid based cytology samples.  WET PREP, GENITAL     Status: Abnormal   Collection Time    07/29/12  7:54 PM      Result Value Range   Yeast Wet Prep HPF POC NONE SEEN  NONE SEEN   Trich, Wet Prep NONE SEEN  NONE SEEN   Clue Cells Wet Prep HPF POC MANY (*) NONE SEEN   WBC, Wet Prep HPF POC FEW (*) NONE SEEN   Psychological Evaluations:  Assessment:   AXIS I:  Polysubstance dependence, Post partum depression AXIS II:  Deferred AXIS III:   Past Medical History  Diagnosis Date  . No pertinent past medical history   . Lumbar herniated disc 2011    L5  . Depression   . Anxiety    AXIS IV:  Substance abuse, familial stressors AXIS V:  11-20 some danger of hurting self or others possible OR occasionally fails to maintain minimal personal hygiene OR gross impairment in communication  Treatment Plan/Recommendations: 1. Admit for crisis management and stabilization, estimated length of stay 3-5 days.  2. Medication management to reduce current symptoms to base line and improve the patient's overall level of functioning  3. Treat health problems as indicated.  4. Develop treatment plan to decrease risk of relapse upon discharge and the need for readmission.  5. Psycho-social education regarding relapse prevention and self care.  6. Health care follow up as needed for medical problems.  7. Review, reconcile, and reinstate any pertinent home medications for other health issues where appropriate. 8. Call for consults with hospitalist for any additional specialty patient care services as needed.  Treatment Plan Summary: Daily contact with patient to assess and evaluate symptoms and progress in treatment Medication management Supportive approach/coping skills/relapse prevention Detox Reassess and address the co morbidities Current Medications:  Current Facility-Administered Medications  Medication Dose  Route Frequency Provider Last Rate Last Dose  . acetaminophen (TYLENOL) tablet 650 mg  650 mg Oral Q6H PRN Shuvon Rankin, NP   650 mg at 07/31/12 0649  . alum & mag hydroxide-simeth (MAALOX/MYLANTA) 200-200-20 MG/5ML suspension 30 mL  30 mL Oral Q4H PRN Shuvon Rankin, NP      . cloNIDine (CATAPRES) tablet 0.1 mg  0.1 mg Oral QID Shuvon  Rankin, NP   0.1 mg at 07/31/12 0949   Followed by  . [START ON 08/02/2012] cloNIDine (CATAPRES) tablet 0.1 mg  0.1 mg Oral BH-qamhs Shuvon Rankin, NP       Followed by  . [START ON 08/04/2012] cloNIDine (CATAPRES) tablet 0.1 mg  0.1 mg Oral QAC breakfast Shuvon Rankin, NP      . dicyclomine (BENTYL) tablet 20 mg  20 mg Oral Q6H PRN Shuvon Rankin, NP      . LORazepam (ATIVAN) tablet 1 mg  1 mg Oral Q8H PRN Shuvon Rankin, NP   1 mg at 07/31/12 0649  . magnesium hydroxide (MILK OF MAGNESIA) suspension 30 mL  30 mL Oral Daily PRN Shuvon Rankin, NP      . methocarbamol (ROBAXIN) tablet 500 mg  500 mg Oral Q8H PRN Shuvon Rankin, NP   500 mg at 07/31/12 0649  . nicotine (NICODERM CQ - dosed in mg/24 hours) patch 21 mg  21 mg Transdermal Daily Rachael Fee, MD   21 mg at 07/31/12 0948  . traZODone (DESYREL) tablet 50 mg  50 mg Oral QHS,MR X 1 Shuvon Rankin, NP   50 mg at 07/30/12 2201    Observation Level/Precautions:  15 minute checks  Laboratory:  Reviewed ED lab findings on file  Psychotherapy:  Group sessions  Medications:  See medication lists  Consultations: As needed   Discharge Concerns:  Safety/sobriety  Estimated LOS: 3-5 days  Other:     I certify that inpatient services furnished can reasonably be expected to improve the patient's condition.   Sanjuana Kava, FNP, PMHNP-BC 7/4/201410:13 AM Agree with assessment and plan Reymundo Poll. Dub Mikes, M.D.

## 2012-07-31 NOTE — Progress Notes (Signed)
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

## 2012-07-31 NOTE — [facility] Suicide Risk Assessment (Signed)
Suicide Risk Assessment  Admission Assessment     Nursing information obtained from:  Patient Demographic factors:  Caucasian Current Mental Status:  Suicidal ideation indicated by patient Loss Factors:  Loss of significant relationship;Financial problems / change in socioeconomic status Historical Factors:  Family history of mental illness or substance abuse;Anniversary of important loss Risk Reduction Factors:  Responsible for children under 40 years of age;Employed;Living with another person, especially a relative  CLINICAL FACTORS:   Depression:   Comorbid alcohol abuse/dependence  COGNITIVE FEATURES THAT CONTRIBUTE TO RISK: None identified   SUICIDE RISK:   Moderate:  Frequent suicidal ideation with limited intensity, and duration, some specificity in terms of plans, no associated intent, good self-control, limited dysphoria/symptomatology, some risk factors present, and identifiable protective factors, including available and accessible social support.  PLAN OF CARE: Supportive approach/coping skills                               Detox with clonidine/Ativan                               Presentation suggests Bipolar Depression: early depression, seasonal, post partum, negative response to antidepressants, suicidal ideas on antidepressants, episodes of mood fluctuations with irritability, increased energy                                Trial with Lamictal/Seroquel  I certify that inpatient services furnished can reasonably be expected to improve the patient's condition.  Cadyn Fann A 07/31/2012, 4:54 PM

## 2012-07-31 NOTE — Progress Notes (Signed)
Adult Psychoeducational Group Note  Date:  07/31/2012 Time:  8:00PM Group Topic/Focus:  Wrap-Up Group:   The focus of this group is to help patients review their daily goal of treatment and discuss progress on daily workbooks.  Participation Level:  Active  Participation Quality:  Appropriate and Attentive  Affect:  Appropriate  Cognitive:  Alert and Appropriate  Insight: Appropriate  Engagement in Group:  Engaged  Modes of Intervention:  Discussion  Additional Comments:  Pt. Attending AA meeting.   Bing Plume D 07/31/2012, 8:10 PM

## 2012-07-31 NOTE — ED Notes (Signed)
Post ED Visit - Positive Culture Follow-up  Culture report reviewed by antimicrobial stewardship pharmacist: []  Wes Dulaney, Pharm.D., BCPS []  Celedonio Miyamoto, Pharm.D., BCPS [x]  Georgina Pillion, Pharm.D., BCPS []  [place], [place].D., BCPS, AAHIVP []  Estella Husk, Pharm.D., BCPS, AAHIVP  Positive urine culture Treated with Amoxicillin, organism sensitive to the same and no further patient follow-up is required at this time.  Larena Sox 07/31/2012, 4:16 PM
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
## 2012-08-01 DIAGNOSIS — F339 Major depressive disorder, recurrent, unspecified: Secondary | ICD-10-CM

## 2012-08-01 DIAGNOSIS — F1120 Opioid dependence, uncomplicated: Secondary | ICD-10-CM

## 2012-08-01 NOTE — Progress Notes (Signed)
[facility] MD Progress Note  08/01/2012 1:58 PM Caleigh Rabelo  MRN:  [id number]  Subjective: Enjoli reports that she is not feeling well. She is still complaining of tough withdrawal symptoms; chills, sweaty, N/V, and abdominal cramps. She remains on her detoxification treatment protocol.  Diagnosis:   Axis I: Polysubstance dependence including opioid type drug, continuous use, Major depressive disorder, recurrent Axis II: Deferred Axis III:  Past Medical History  Diagnosis Date  . No pertinent past medical history   . Lumbar herniated disc 2011    L5  . Depression   . Anxiety    Axis IV: Polysubstance abuse Axis V: 41-50 serious symptoms  ADL's:  Intact, fairly  Sleep: Poor  Appetite:  Fair  Suicidal Ideation:  Plan:  Denies Intent:  Denies Means:  Denies Homicidal Ideation:  Plan:  Denies Intent:  denies Means:  Denies AEB (as evidenced by):  Psychiatric Specialty Exam: Review of Systems  Constitutional: Positive for chills, malaise/fatigue and diaphoresis.  HENT: Negative.   Eyes: Negative.   Respiratory: Negative.   Cardiovascular: Negative.   Gastrointestinal: Positive for nausea, vomiting, abdominal pain and diarrhea.  Genitourinary: Negative.   Musculoskeletal: Positive for myalgias.  Skin: Negative.   Neurological: Positive for tremors and weakness.  Endo/Heme/Allergies: Negative.   Psychiatric/Behavioral: Positive for depression (Rated at #2) and substance abuse (Polysbstance dependence). Negative for suicidal ideas, hallucinations and memory loss. The patient is nervous/anxious (Currently being stabilized with medication) and has insomnia (Currently being stabilized with medication).     Blood pressure 124/78, pulse 76, temperature 97.6 F (36.4 C), temperature source Oral, resp. rate 16, height 5\' 3"  (1.6 m), weight 62.143 kg (137 lb), last menstrual period 07/28/2012.Body mass index is 24.27 kg/(m^2).  General Appearance: Fairly Groomed  Patent attorney::  Fair   Speech:  Clear and Coherent  Volume:  Normal  Mood:  Anxious, Depressed and Irritable  Affect:  Restricted  Thought Process:  Coherent and Goal Directed  Orientation:  Full (Time, Place, and Person)  Thought Content:  Rumination  Suicidal Thoughts:  No  Homicidal Thoughts:  No  Memory:  Immediate;   Good Recent;   Good Remote;   Good  Judgement:  Impaired  Insight:  Fair  Psychomotor Activity:  Restlessness and Tremor  Concentration:  Fair  Recall:  Good  Akathisia:  No  Handed:  Right  AIMS (if indicated):     Assets:  Desire for Improvement  Sleep:  Number of Hours: 6   Current Medications: Current Facility-Administered Medications  Medication Dose Route Frequency Provider Last Rate Last Dose  . acetaminophen (TYLENOL) tablet 650 mg  650 mg Oral Q6H PRN Shuvon Rankin, NP   650 mg at 07/31/12 2212  . alum & mag hydroxide-simeth (MAALOX/MYLANTA) 200-200-20 MG/5ML suspension 30 mL  30 mL Oral Q4H PRN Shuvon Rankin, NP      . cloNIDine (CATAPRES) tablet 0.1 mg  0.1 mg Oral QID Shuvon Rankin, NP   0.1 mg at 08/01/12 1302   Followed by  . [START ON 08/02/2012] cloNIDine (CATAPRES) tablet 0.1 mg  0.1 mg Oral BH-qamhs Shuvon Rankin, NP       Followed by  . [START ON 08/04/2012] cloNIDine (CATAPRES) tablet 0.1 mg  0.1 mg Oral QAC breakfast Shuvon Rankin, NP      . dicyclomine (BENTYL) tablet 20 mg  20 mg Oral Q6H PRN Shuvon Rankin, NP      . lamoTRIgine (LAMICTAL) tablet 25 mg  25 mg Oral Daily Rachael Fee, MD  25 mg at 08/01/12 0816  . LORazepam (ATIVAN) tablet 1 mg  1 mg Oral Q4H PRN Rachael Fee, MD   1 mg at 08/01/12 1302  . magnesium hydroxide (MILK OF MAGNESIA) suspension 30 mL  30 mL Oral Daily PRN Shuvon Rankin, NP      . methocarbamol (ROBAXIN) tablet 500 mg  500 mg Oral Q8H PRN Shuvon Rankin, NP   500 mg at 08/01/12 0816  . nicotine (NICODERM CQ - dosed in mg/24 hours) patch 21 mg  21 mg Transdermal Daily Rachael Fee, MD   21 mg at 08/01/12 0815  . QUEtiapine (SEROQUEL)  tablet 25 mg  25 mg Oral TID PRN Rachael Fee, MD   25 mg at 08/01/12 1302  . traZODone (DESYREL) tablet 50 mg  50 mg Oral QHS,MR X 1 Shuvon Rankin, NP   50 mg at 07/31/12 2212    Lab Results: No results found for this or any previous visit (from the past 48 hour(s)).  Physical Findings: AIMS: Facial and Oral Movements Muscles of Facial Expression: None, normal Lips and Perioral Area: None, normal Jaw: None, normal Tongue: None, normal,Extremity Movements Upper (arms, wrists, hands, fingers): None, normal Lower (legs, knees, ankles, toes): None, normal, Trunk Movements Neck, shoulders, hips: None, normal, Overall Severity Severity of abnormal movements (highest score from questions above): None, normal Incapacitation due to abnormal movements: None, normal Patient's awareness of abnormal movements (rate only patient's report): No Awareness, Dental Status Current problems with teeth and/or dentures?: No Does patient usually wear dentures?: No  CIWA:  CIWA-Ar Total: 0 COWS:  COWS Total Score: 0  Treatment Plan Summary: Daily contact with patient to assess and evaluate symptoms and progress in treatment Medication management  Plan: Supportive approach/coping skills/relapse prevention. Continue detox treatment Encouraged out of room, participation in group sessions and application of coping skills when distressed. Will continue to monitor response to/adverse effects of medications in use to assure effectiveness. Continue to monitor mood, behavior and interaction with staff and other patients. Continue current plan of care.   Medical Decision Making Problem Points:  Established problem, stable/improving (1), Review of last therapy session (1) and Review of psycho-social stressors (1) Data Points:  Review of medication regiment & side effects (2) Review of new medications or change in dosage (2)  I certify that inpatient services furnished can reasonably be expected to improve the  patient's condition.   Armandina Stammer I, PMHNP-BC 08/01/2012, 1:58 PM

## 2012-08-01 NOTE — Progress Notes (Signed)
D   Pt spends most of her time in bed  She has not attended any groups and has minimal interaction with others   She frequently request medications and asks for all she can get   Pt appears depressed and anxious   She reports moderate signs of withdrawal when she is requesting medications   A   Verbal support given   Medications administered and effectiveness monitored   Q 15 min checks R   Pt safe at present

## 2012-08-01 NOTE — Progress Notes (Signed)
Patient ID: Alexa Sims, female   DOB: 04-09-1970, 42 y.o.   MRN: [id number] D. The patient has a full range of mood and affect. Interacting in the milieu appropriately. Cigarettes were found in her room during environmental checks. She denied that they were hers suggesting they belonged to her roommate who had been discharged. Former roommate was not a smoker. Matches were not found. Frequently asking for medication. A. Met with patient to assess. Encouraged to attend evening AA/NA group. Administered and reviewed medications with the patient. Spoke to the patient regarding safety issues related to smoking in the hospital and potential consequences. R. The patient attended evening group. Stated that she was craving drugs today but was able to interact in the milieu.

## 2012-08-01 NOTE — Progress Notes (Signed)
Adult Psychoeducational Group Note  Date:  08/01/2012 Time:  1300  Group Topic/Focus:  Making Healthy Choices:   The focus of this group is to help patients identify negative/unhealthy choices they were using prior to admission and identify positive/healthier coping strategies to replace them upon discharge. Recovery Goals:   The focus of this group is to identify appropriate goals for recovery and establish a plan to achieve them.  Participation Level:  Did Not Attend  Pt decided not to attend group.  Alexa Sims [name] 08/01/2012, 2:58 PM

## 2012-08-01 NOTE — Progress Notes (Signed)
Psychoeducational Group Note  Date:  08/01/2012 Time:  0945 am  Group Topic/Focus:  Identifying Needs:   The focus of this group is to help patients identify their personal needs that have been historically problematic and identify healthy behaviors to address their needs.  Participation Level:  Did Not Attend  Andrena Mews 08/01/2012,9:41 AM

## 2012-08-01 NOTE — [facility] Group Notes (Signed)
[facility] Group Notes: (Clinical Social Work)   08/01/2012      Type of Therapy:  Group Therapy   Participation Level:  Did Not Attend    Mirely Pangle Grossman-Orr, LCSW 08/01/2012, 12:20 PM     

## 2012-08-01 NOTE — Progress Notes (Signed)
Patient ID: Alexa Sims, female   DOB: [birth year], 42 y.o.   MRN: [id number] D. The patient attended evening AA/NA group. Interacting appropriately in the milieu.  A. Reviewed new medications with the patient. Administered HS medications and medicated for back pain. R. The patient was receptive to trying new medication ordered by her doctor.

## 2012-08-02 LAB — URINALYSIS, ROUTINE W REFLEX MICROSCOPIC
Bilirubin Urine: NEGATIVE
Ketones, ur: NEGATIVE mg/dL
Nitrite: NEGATIVE
Protein, ur: NEGATIVE mg/dL
Urobilinogen, UA: 0.2 mg/dL (ref 0.0–1.0)

## 2012-08-02 MED ORDER — LAMOTRIGINE 25 MG PO TABS
50.0000 mg | ORAL_TABLET | Freq: Every day | ORAL | Status: DC
  Administered 2012-08-03 – 2012-08-04 (×2): 50 mg via ORAL
  Filled 2012-08-02: qty 1
  Filled 2012-08-02 (×3): qty 2
  Filled 2012-08-02: qty 28

## 2012-08-02 NOTE — Progress Notes (Signed)
Patient ID: Alexa Sims, female   DOB: May 03, 1970, 42 y.o.   MRN: [id number] [facility] MD Progress Note  08/02/2012 2:08 PM Tessia Kassin  MRN:  [id number]  Subjective: [name] is endorsing that she is craving drugs. She states that she understands that staying off of drugs is going to be a challenge. That is why she has other plans in place to help her achieve optimum sobriety like joining the AA/NA meetings. She also says staying on her medicines will be an assets to helping her cope and focus better. She adds that relying on her strong family support system will help her get through a lot of struggles. She complains of burning with urination.  Diagnosis:   Axis I: Polysubstance dependence including opioid type drug, continuous use, Major depressive disorder, recurrent Axis II: Deferred Axis III:  Past Medical History  Diagnosis Date  . No pertinent past medical history   . Lumbar herniated disc 2011    L5  . Depression   . Anxiety    Axis IV: Polysubstance abuse Axis V: 41-50 serious symptoms  ADL's:  Intact, fairly  Sleep: Poor  Appetite:  Fair  Suicidal Ideation:  Plan:  Denies Intent:  Denies Means:  Denies Homicidal Ideation:  Plan:  Denies Intent:  denies Means:  Denies AEB (as evidenced by):  Psychiatric Specialty Exam: Review of Systems  Constitutional: Positive for chills, malaise/fatigue and diaphoresis.  HENT: Negative.   Eyes: Negative.   Respiratory: Negative.   Cardiovascular: Negative.   Gastrointestinal: Positive for nausea, vomiting, abdominal pain and diarrhea.  Genitourinary: Negative.   Musculoskeletal: Positive for myalgias.  Skin: Negative.   Neurological: Positive for tremors and weakness.  Endo/Heme/Allergies: Negative.   Psychiatric/Behavioral: Positive for depression (Rated at #2) and substance abuse (Polysbstance dependence). Negative for suicidal ideas, hallucinations and memory loss. The patient is nervous/anxious (Currently being stabilized with  medication) and has insomnia (Currently being stabilized with medication).     Blood pressure 107/68, pulse 79, temperature 98 F (36.7 C), temperature source Oral, resp. rate 20, height 5\' 3"  (1.6 m), weight 62.143 kg (137 lb), last menstrual period 07/28/2012.Body mass index is 24.27 kg/(m^2).  General Appearance: Fairly Groomed  Patent attorney::  Fair  Speech:  Clear and Coherent  Volume:  Normal  Mood:  Anxious, Depressed and Irritable  Affect:  Restricted  Thought Process:  Coherent and Goal Directed  Orientation:  Full (Time, Place, and Person)  Thought Content:  Rumination  Suicidal Thoughts:  No  Homicidal Thoughts:  No  Memory:  Immediate;   Good Recent;   Good Remote;   Good  Judgement:  Impaired  Insight:  Fair  Psychomotor Activity:  Restlessness and Tremor  Concentration:  Fair  Recall:  Good  Akathisia:  No  Handed:  Right  AIMS (if indicated):     Assets:  Desire for Improvement  Sleep:  Number of Hours: 6   Current Medications: Current Facility-Administered Medications  Medication Dose Route Frequency Provider Last Rate Last Dose  . acetaminophen (TYLENOL) tablet 650 mg  650 mg Oral Q6H PRN Shuvon Rankin, NP   650 mg at 08/02/12 1149  . alum & mag hydroxide-simeth (MAALOX/MYLANTA) 200-200-20 MG/5ML suspension 30 mL  30 mL Oral Q4H PRN Shuvon Rankin, NP      . cloNIDine (CATAPRES) tablet 0.1 mg  0.1 mg Oral BH-qamhs Shuvon Rankin, NP   0.1 mg at 08/02/12 0807   Followed by  . [START ON 08/04/2012] cloNIDine (CATAPRES) tablet 0.1 mg  0.1  mg Oral QAC breakfast Shuvon Rankin, NP      . dicyclomine (BENTYL) tablet 20 mg  20 mg Oral Q6H PRN Shuvon Rankin, NP      . lamoTRIgine (LAMICTAL) tablet 25 mg  25 mg Oral Daily Rachael Fee, MD   25 mg at 08/02/12 0807  . LORazepam (ATIVAN) tablet 1 mg  1 mg Oral Q4H PRN Rachael Fee, MD   1 mg at 08/02/12 1254  . magnesium hydroxide (MILK OF MAGNESIA) suspension 30 mL  30 mL Oral Daily PRN Shuvon Rankin, NP      . methocarbamol  (ROBAXIN) tablet 500 mg  500 mg Oral Q8H PRN Shuvon Rankin, NP   500 mg at 08/02/12 0807  . nicotine (NICODERM CQ - dosed in mg/24 hours) patch 21 mg  21 mg Transdermal Daily Rachael Fee, MD   21 mg at 08/02/12 0806  . QUEtiapine (SEROQUEL) tablet 25 mg  25 mg Oral TID PRN Rachael Fee, MD   25 mg at 08/02/12 1149  . traZODone (DESYREL) tablet 50 mg  50 mg Oral QHS,MR X 1 Shuvon Rankin, NP   50 mg at 08/01/12 2126    Lab Results: No results found for this or any previous visit (from the past 48 hour(s)).  Physical Findings: AIMS: Facial and Oral Movements Muscles of Facial Expression: None, normal Lips and Perioral Area: None, normal Jaw: None, normal Tongue: None, normal,Extremity Movements Upper (arms, wrists, hands, fingers): None, normal Lower (legs, knees, ankles, toes): None, normal, Trunk Movements Neck, shoulders, hips: None, normal, Overall Severity Severity of abnormal movements (highest score from questions above): None, normal Incapacitation due to abnormal movements: None, normal Patient's awareness of abnormal movements (rate only patient's report): No Awareness, Dental Status Current problems with teeth and/or dentures?: No Does patient usually wear dentures?: No  CIWA:  CIWA-Ar Total: 0 COWS:  COWS Total Score: 0  Treatment Plan Summary: Daily contact with patient to assess and evaluate symptoms and progress in treatment Medication management  Plan: Supportive approach/coping skills/relapse prevention. Increase Lamictal from 25 mg to 50 mg. Obtain urinalysis. Encouraged out of room, participation in group sessions and application of coping skills when distressed. Will continue to monitor response to/adverse effects of medications in use to assure effectiveness. Continue to monitor mood, behavior and interaction with staff and other patients. Continue current plan of care.   Medical Decision Making Problem Points:  Established problem, stable/improving (1),  Review of last therapy session (1) and Review of psycho-social stressors (1) Data Points:  Review of medication regiment & side effects (2) Review of new medications or change in dosage (2)  I certify that inpatient services furnished can reasonably be expected to improve the patient's condition.   Armandina Stammer I, PMHNP-BC 08/02/2012, 2:08 PM

## 2012-08-02 NOTE — Progress Notes (Signed)
D   Pt has been requesting medications frequently again today  She reports poor sleep and she isolates to her room   She does not attend groups and interacts very minimally with others   She said all she wants to do is stay in bed A   Verbal support given  Medications administered and effectiveness monitored   Q 15 min checks  Continue to encourage pt to attend groups

## 2012-08-02 NOTE — Progress Notes (Signed)
Adult Psychoeducational Group Note  Date:  08/02/2012 Time:  1300  Group Topic/Focus:  Recovery Goals:   The focus of this group is to identify appropriate goals for recovery and establish a plan to achieve them. Spirituality:   The focus of this group is to discuss how one's spirituality can aide in recovery.  Participation Level:  Active  Participation Quality:  Attentive and Sharing  Affect:  Appropriate  Cognitive:  Alert  Insight: Improving  Engagement in Group:  Engaged  Modes of Intervention:  Discussion, Education and Exploration  Additional Comments:  Pt is engaged in group. She is somewhat resistant but her insight is improving and she is receptive to staff input.  Cally Nygard [name] 08/02/2012, 4:59 PM

## 2012-08-02 NOTE — [facility] Group Notes (Signed)
[facility] Group Notes:  (Clinical Social Work)  08/02/2012  10:00-11:00AM  Summary of Progress/Problems:   The main focus of today's process group was to   identify the patient's current support system and decide on other supports that can be put in place.  Four definitions/levels of support were discussed and an exercise was utilized to show how much stronger we become with additional supports.  An emphasis was placed on using counselor, doctor, therapy groups, 12-step groups, and problem-specific support groups to expand supports, as well as doing something different than has been done before. The patient expressed a willingness to add an AA or NA sponsor, meetings, and church involvement to her already existent supports of husband, kids, grandmother, mother father, brother, sisters, and friends.  Type of Therapy:  Process Group with Motivational Interviewing  Participation Level:  Active  Participation Quality:  Attentive, Sharing and Supportive  Affect:  Appropriate  Cognitive:  Oriented  Insight:  Engaged  Engagement in Therapy:  Engaged  Modes of Intervention:   Education, Support and Processing, Activity  [employer], LCSW 08/02/2012, 12:08 PM

## 2012-08-02 NOTE — Progress Notes (Signed)
Nutrition Note  Patient identified on the Malnutrition Screening Tool (MST) Report  Body mass index is 24.27 kg/(m^2). Patient meets criteria for normal body weight based on current BMI.   Current diet order is regular.  Labs and medications reviewed.   Pt reports that she follows a healthy diet at home and that she has seen a dietitian in the past. She says that her appetite decreased while she was experiencing withdrawals, but that she has gotten it back. Her weight has increased from 134 lbs to 137 lbs since admission to [facility]. She also reports that she exercises at home.   No nutrition interventions warranted at this time. If nutrition issues arise, please consult RD.   Alexa Sims RD, LDN

## 2012-08-02 NOTE — Progress Notes (Signed)
Adult Psychoeducational Group Note  Date:  08/02/2012 Time:  5:39 PM  Group Topic/Focus:  Different Perspectives  Participation Level:  Active  Participation Quality:  Appropriate, Redirectable, Sharing and Supportive  Affect:  Appropriate  Cognitive:  Alert and Appropriate  Insight: Appropriate and Good  Engagement in Group:  Engaged and Supportive  Modes of Intervention:  Discussion, Socialization and Support  Additional Comments:  Pt attended group and actively participated. Pt stated that her irrational thought was that there were dope monsters under her bed calling her. Pt turned her irrational thought into a positive one by saying when she does feel like there are dope monsters under her bed to go to an AA/NA meeting or talk to a supportive friend.The purpose of this group is to identify at least one positive thought that challenges an irrational one. The patient will be able to discuss the importance of seeing things from a different perspective.  Caswell Corwin 08/02/2012, 5:39 PM

## 2012-08-02 NOTE — Progress Notes (Signed)
Patient did attend the evening speaker AA meeting.  

## 2012-08-02 NOTE — Progress Notes (Signed)
Psychoeducational Group Note  Date:  08/02/2012 Time:  0945 am  Group Topic/Focus:  Making Healthy Choices:   The focus of this group is to help patients identify negative/unhealthy choices they were using prior to admission and identify positive/healthier coping strategies to replace them upon discharge.  Participation Level:  Did Not Attend    Johanna Stafford J 08/02/2012, 10:29 AM 

## 2012-08-03 DIAGNOSIS — F3130 Bipolar disorder, current episode depressed, mild or moderate severity, unspecified: Secondary | ICD-10-CM

## 2012-08-03 DIAGNOSIS — F1920 Other psychoactive substance dependence, uncomplicated: Principal | ICD-10-CM

## 2012-08-03 MED ORDER — QUETIAPINE FUMARATE 50 MG PO TABS
50.0000 mg | ORAL_TABLET | Freq: Three times a day (TID) | ORAL | Status: DC | PRN
  Administered 2012-08-03 – 2012-08-04 (×3): 50 mg via ORAL
  Filled 2012-08-03: qty 1
  Filled 2012-08-03: qty 30
  Filled 2012-08-03 (×2): qty 1

## 2012-08-03 NOTE — [facility] Group Notes (Signed)
[facility] LCSW Aftercare Discharge Planning Group Note   08/03/2012 9:45 AM  Participation Quality:  Appropriate   Mood/Affect:  Appropriate  Depression Rating:  1  Anxiety Rating:  3  Thoughts of Suicide:  No Will you contract for safety?   NA  Current AVH:  No  Plan for Discharge/Comments:  Pt follows up at [facility] for med management and therapy with Raynelle Fanning. Pt also interested in CD IOP at [facility]. CSW to ask Dewayne Hatch to visit with pt to talk about IOP program with pt today.   Transportation Means: family  Supports: family  Smart, [place]

## 2012-08-03 NOTE — Progress Notes (Signed)
Adult Psychoeducational Group Note  Date:  08/03/2012 Time:  8:00PM Group Topic/Focus:  Wrap-Up Group:   The focus of this group is to help patients review their daily goal of treatment and discuss progress on daily workbooks.  Participation Level:  Active  Participation Quality:  Appropriate and Attentive  Affect:  Appropriate  Cognitive:  Alert and Appropriate  Insight: Appropriate  Engagement in Group:  Engaged  Modes of Intervention:  Discussion  Additional Comments: Pt. Was attentive and appropriate during tonights Wrap up group AA meeting.   Bing Plume D 08/03/2012, 8:13 PM

## 2012-08-03 NOTE — Progress Notes (Signed)
Patient ID: Alexa Sims, female   DOB: [birth year], 42 y.o.   MRN: [id number] The patient has a bright mood and affect. She is very social interacting in the milieu. Attended evening AA/NA group. Stated that she will be going to the out patient department at [facility] for her aftercare. Denies any suicidal ideation. No symptoms of withdrawal evident.

## 2012-08-03 NOTE — Progress Notes (Signed)
Patient did attend the evening speaker AA meeting.  

## 2012-08-03 NOTE — Progress Notes (Signed)
[facility] MD Progress Note  08/03/2012 5:16 PM Alexa Sims  MRN:  [id number] Subjective:  Had worked on getting off the Ativan. states she feels the Seroquel helps but does not last that long. She is planning to go back to work when she gets out, do CD IOP. States that her husband has already gotten rid of the friends that are influential in her use. She is planning to continue to work on herself. States that she has been able to work on her losses. Diagnosis:  Polysubstance Dependence including opioid, Bipolar Depression  ADL's:  Intact  Sleep: Fair  Appetite:  Fair  Suicidal Ideation:  Plan:  denies Intent:  denies Means:  denies Homicidal Ideation:  Plan:  denies Intent:  denies Means:  denies AEB (as evidenced by):  Psychiatric Specialty Exam: Review of Systems  Constitutional: Negative.   HENT: Negative.   Eyes: Negative.   Respiratory: Negative.   Cardiovascular: Negative.   Gastrointestinal: Negative.   Genitourinary: Negative.   Musculoskeletal: Negative.   Skin: Negative.   Neurological: Negative.   Endo/Heme/Allergies: Negative.   Psychiatric/Behavioral: Positive for depression and substance abuse. The patient is nervous/anxious.     Blood pressure 127/68, pulse 83, temperature 98.2 F (36.8 C), temperature source Oral, resp. rate 16, height 5\' 3"  (1.6 m), weight 62.143 kg (137 lb), last menstrual period 07/28/2012.Body mass index is 24.27 kg/(m^2).  General Appearance: Fairly Groomed  Patent attorney::  Fair  Speech:  Clear and Coherent  Volume:  Normal  Mood:  Anxious  Affect:  anxious  Thought Process:  Coherent and Goal Directed  Orientation:  Full (Time, Place, and Person)  Thought Content:  Rumination  Suicidal Thoughts:  No  Homicidal Thoughts:  No  Memory:  Immediate;   Fair Recent;   Fair Remote;   Fair  Judgement:  Fair  Insight:  Present  Psychomotor Activity:  Restlessness  Concentration:  Fair  Recall:  Fair  Akathisia:  No  Handed:  Right   AIMS (if indicated):     Assets:  Desire for Improvement  Sleep:  Number of Hours: 5   Current Medications: Current Facility-Administered Medications  Medication Dose Route Frequency Provider Last Rate Last Dose  . acetaminophen (TYLENOL) tablet 650 mg  650 mg Oral Q6H PRN Shuvon Rankin, NP   650 mg at 08/03/12 5784  . alum & mag hydroxide-simeth (MAALOX/MYLANTA) 200-200-20 MG/5ML suspension 30 mL  30 mL Oral Q4H PRN Shuvon Rankin, NP      . cloNIDine (CATAPRES) tablet 0.1 mg  0.1 mg Oral BH-qamhs Shuvon Rankin, NP   0.1 mg at 08/03/12 0753   Followed by  . [START ON 08/04/2012] cloNIDine (CATAPRES) tablet 0.1 mg  0.1 mg Oral QAC breakfast Shuvon Rankin, NP      . dicyclomine (BENTYL) tablet 20 mg  20 mg Oral Q6H PRN Shuvon Rankin, NP      . lamoTRIgine (LAMICTAL) tablet 50 mg  50 mg Oral Daily Sanjuana Kava, NP   50 mg at 08/03/12 0752  . LORazepam (ATIVAN) tablet 1 mg  1 mg Oral Q4H PRN Rachael Fee, MD   1 mg at 08/03/12 0756  . magnesium hydroxide (MILK OF MAGNESIA) suspension 30 mL  30 mL Oral Daily PRN Shuvon Rankin, NP      . methocarbamol (ROBAXIN) tablet 500 mg  500 mg Oral Q8H PRN Shuvon Rankin, NP   500 mg at 08/03/12 1152  . nicotine (NICODERM CQ - dosed in mg/24 hours) patch 21  mg  21 mg Transdermal Daily Rachael Fee, MD   21 mg at 08/03/12 0753  . QUEtiapine (SEROQUEL) tablet 50 mg  50 mg Oral TID PRN Rachael Fee, MD      . traZODone (DESYREL) tablet 50 mg  50 mg Oral QHS,MR X 1 Shuvon Rankin, NP   50 mg at 08/02/12 2124    Lab Results:  Results for orders placed during the hospital encounter of 07/30/12 (from the past 48 hour(s))  URINALYSIS, ROUTINE W REFLEX MICROSCOPIC     Status: Abnormal   Collection Time    08/02/12  2:31 PM      Result Value Range   Color, Urine YELLOW  YELLOW   APPearance CLOUDY (*) CLEAR   Specific Gravity, Urine 1.012  1.005 - 1.030   pH 6.5  5.0 - 8.0   Glucose, UA NEGATIVE  NEGATIVE mg/dL   Hgb urine dipstick LARGE (*) NEGATIVE    Bilirubin Urine NEGATIVE  NEGATIVE   Ketones, ur NEGATIVE  NEGATIVE mg/dL   Protein, ur NEGATIVE  NEGATIVE mg/dL   Urobilinogen, UA 0.2  0.0 - 1.0 mg/dL   Nitrite NEGATIVE  NEGATIVE   Leukocytes, UA MODERATE (*) NEGATIVE  URINE MICROSCOPIC-ADD ON     Status: Abnormal   Collection Time    08/02/12  2:31 PM      Result Value Range   Squamous Epithelial / LPF FEW (*) RARE   WBC, UA 11-20  <3 WBC/hpf    Physical Findings: AIMS: Facial and Oral Movements Muscles of Facial Expression: None, normal Lips and Perioral Area: None, normal Jaw: None, normal Tongue: None, normal,Extremity Movements Upper (arms, wrists, hands, fingers): None, normal Lower (legs, knees, ankles, toes): None, normal, Trunk Movements Neck, shoulders, hips: None, normal, Overall Severity Severity of abnormal movements (highest score from questions above): None, normal Incapacitation due to abnormal movements: None, normal Patient's awareness of abnormal movements (rate only patient's report): No Awareness, Dental Status Current problems with teeth and/or dentures?: No Does patient usually wear dentures?: No  CIWA:  CIWA-Ar Total: 0 COWS:  COWS Total Score: 0  Treatment Plan Summary: Daily contact with patient to assess and evaluate symptoms and progress in treatment Medication management  Plan: Supportive approach/coping skills/relapse prevention           D/C the Ativan            Increase the Seroquel to 50 mg  Medical Decision Making Problem Points:  Review of psycho-social stressors (1) Data Points:  Review of medication regiment & side effects (2)  I certify that inpatient services furnished can reasonably be expected to improve the patient's condition.   [name] A 08/03/2012, 5:16 PM

## 2012-08-03 NOTE — [facility] Group Notes (Signed)
[facility] LCSW Group Therapy  08/03/2012 3:04 PM  Type of Therapy:  Discharge Planning  Participation Level:  Active  Participation Quality:  Attentive  Affect:  Appropriate  Cognitive:  Appropriate  Insight:  Engaged  Engagement in Therapy:  Engaged  Modes of Intervention:  Discussion, Education, Exploration, Socialization and Support  Summary of Progress/Problems: Today's Topic: Overcoming Obstacles. Pt identified obstacles faced currently and processed barriers involved in overcoming these obstacles. Pt identified steps necessary for overcoming these obstacles and explored motivation (internal and external) for facing these difficulties head on. Pt further identified one area of concern in their lives and chose a skill of focus pulled from their "toolbox." Alexa Sims identified her obstacles as "staying away from negative people and maintaining sobriety when experiencing cravings." Alexa Sims processed healthy coping skills that she can utilize when experiencing urges/cravings and identified people/supports in her life that she can reach out to. Alexa Sims also stated that she plans to attend AA/NA groups and MHA groups in order to fill her time up during the day.    Smart, Norvell Ureste 08/03/2012, 3:04 PM

## 2012-08-04 MED ORDER — TRAZODONE HCL 50 MG PO TABS: 50.0000 mg | ORAL_TABLET | Freq: Every evening | ORAL | Status: DC | PRN

## 2012-08-04 MED ORDER — QUETIAPINE FUMARATE 50 MG PO TABS: 50.0000 mg | ORAL_TABLET | Freq: Three times a day (TID) | ORAL | Status: DC | PRN

## 2012-08-04 MED ORDER — LAMOTRIGINE 25 MG PO TABS: 50.0000 mg | ORAL_TABLET | Freq: Every day | ORAL | Status: DC

## 2012-08-04 NOTE — [facility] Group Notes (Signed)
[facility] LCSW Aftercare Discharge Planning Group Note   08/04/2012 9:35 AM  Participation Quality:  Appropriate   Mood/Affect:  Appropriate  Depression Rating:  1  Anxiety Rating:  3  Thoughts of Suicide:  No Will you contract for safety?   NA  Current AVH:  No  Plan for Discharge/Comments: Pt will follow up at [facility] for med management and therapy (groups) and has appt set. Pt visited by [facility] in CDIOP and decided that she will get insurance before entering CDIOP program.     Transportation Means:  Friend (AMY)  Supports: friends/family/husband  Smart, [employer]

## 2012-08-04 NOTE — [facility] Suicide Risk Assessment (Signed)
[facility] INPATIENT:  Family/Significant Other Suicide Prevention Education  Suicide Prevention Education:  Education Completed; Debbrah Sampedro (pt's mother) has been identified by the patient as the family member/significant other with whom the patient will be residing, and identified as the person(s) who will aid the patient in the event of a mental health crisis (suicidal ideations/suicide attempt).  With written consent from the patient, the family member/significant other has been provided the following suicide prevention education, prior to the and/or following the discharge of the patient.  The suicide prevention education provided includes the following:  Suicide risk factors  Suicide prevention and interventions  National Suicide Hotline telephone number  [facility] assessment telephone number  [facility] Emergency Assistance 911  [facility] and/or Residential Mobile Crisis Unit telephone number  Request made of family/significant other to:  Remove weapons (e.g., guns, rifles, knives), all items previously/currently identified as safety concern.    Remove drugs/medications (over-the-counter, prescriptions, illicit drugs), all items previously/currently identified as a safety concern.  The family member/significant other verbalizes understanding of the suicide prevention education information provided.  The family member/significant other agrees to remove the items of safety concern listed above.  Alexa Sims, Alexa Sims 08/04/2012, 9:44 AM

## 2012-08-04 NOTE — Progress Notes (Signed)
D - Pt alert and oriented x 4, active on the unit and interacting with peers appropriately. Pt attended group tonight. Per pt self assessment sheet pt rates her depression 1 and hopelessness 1 and anxiety 2. Pt appears slightly anxious this evening. Pt denies SI/HI or any auditory or visual hallucinations.  A - Pt given support and encouragement through therapeutic conversation. Encouraged pt to speak with staff about any concerns or questions. Medications given as ordered.  R - Pt safety maintained with Q 15 minute rounds. Pt remains safe on the unit.

## 2012-08-04 NOTE — Progress Notes (Signed)
Pt was discharged home today.  She denied any S/I H/I or A/V hallucinations.    She was given f/u appointment, rx, sample medications, hotline info booklet.  She voiced understanding to all instructions provided.  She declined the need for smoking cessation materials.  She removed her nicotine patch before she left.

## 2012-08-04 NOTE — Discharge Summary (Signed)
Physician Discharge Summary Note  Patient:  Alexa Sims is an 42 y.o., female MRN:  [id number] DOB:  08/28/1970 Patient phone:  [phone] (home)  Patient address:   [address] [place] [address],   Date of Admission:  07/30/2012 Date of Discharge: 08/04/12  Reason for Admission:  Drug detox  Discharge Diagnoses: Principal Problem:   Polysubstance dependence including opioid type drug, continuous use Active Problems:   Major depressive disorder, recurrent episode, moderate   Polysubstance dependence  Review of Systems  Constitutional: Negative.   HENT: Negative.   Eyes: Negative.   Respiratory: Negative.   Cardiovascular: Negative.   Gastrointestinal: Negative.   Genitourinary: Negative.   Musculoskeletal: Negative.   Skin: Negative.   Neurological: Negative.   Endo/Heme/Allergies: Negative.   Psychiatric/Behavioral: Positive for depression (Stabilized with medication prior to discharge) and substance abuse (Polysubstance abuse/dependencey). Negative for suicidal ideas, hallucinations and memory loss. The patient is nervous/anxious (Stabilized with medication prior to discharge) and has insomnia (Stabilized with medication prior to discharge).    Axis Diagnosis:   AXIS I:  Polysubstance dependence including opioid type drug, continuous use, Major depressive disorder, recurrent episodes AXIS II:  Deferred AXIS III:   Past Medical History  Diagnosis Date  . No pertinent past medical history   . Lumbar herniated disc 2011    L5  . Depression   . Anxiety    AXIS IV:  Polysubstance dependence AXIS V:  64  Level of Care:  OP  Hospital Course:  This is a 42 year old Caucasian female. Admitted to [facility] from the [facility] with complaints of suicidal ideations with plans to either jump off a bridge and or run in front of an incoming traffic. Patient reports, "My husband took me to the hospital yesterday. I was having bad thoughts about killing myself. I  was using street drugs. I was using anything and everything that I can get my hands on. I was taking taking these drugs to help me cope because my post-partum drug did not work or help me. I was on Viibryd after I had a baby 1 year ago. This medicine made me feel weird and do crazy stuff. I had a baby 1 year ago. 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 I was started on Viibryd for it. The viibryd made feel worse. I stopped taking it. But I will go out on the streets and buy Opiates, Benzodiazepines and cocaine. I recently lost a baby too. That was 4 & 1/2 weeks pregnancy. Again, the only way that I can deal with all these issues, maintain my job and take care of my family is to take drugs. Then, drug use is making me think of killing myself. I'm only suicidal when and or after I use drugs. I was going to [facility] for psychiatric care".  Upon admission into this hospital, and after admission assessment/evaluation, it was determined that Alexa Sims will need detoxification treatment protocol to stabilize her systems of drug intoxication and to combat the withdrawal symptoms of these substances as well. Her UDS reports indicated (+) for multiple substances, (Amphetamines, Benzodiazepines, opiates and cocaine). She was then started on clonidine detoxification treatment protocol for her detoxification treatment. She was also enrolled in group counseling sessions and activities where she was counseled, taught and learned coping skills that should help her after discharge to cope better with her symptoms and manage her substance abuse/dependency issues for a much sustained sobreity. She also was enrolled/participated in the AA/NA meetings being offered  and held on this unit. She has or  rather presented with no previously existing and or identifiable medical conditions that required treatment and or monitoring. However, she did receive some medication management for some minor health issues. She was monitored closely for any  potential problems that may arise as a result of and or during detoxification treatment. Patient tolerated her detoxification treatment without any significant adverse effects and or reactions reported and or presented. Alexa Sims did make a request to not to be referred to any long term treatment centers for further substance abuse treatment. It was also determined while a patient here that there may be an underlying mental health issues going on with Alexa Sims based on her erratic behaviors and substance pattern.   Besides the detoxification treatment protocol, Alexa Sims also was ordered and received Seroquel 50 mg tid for mood control/anxiety issues, Lamictal 50 mg daily for mood stabilization and Trazodone 50 mg Q bedtime for sleep. Patient attended treatment team meeting this am and met with the treatment team members. Her reason for admission, present symptoms, substance abuse issues, response to to treatment and discharge plans discussed. Patient endorsed that she is doing well and stable for discharge to pursue the next phase of her substance abuse treatment/psychiatric care. It was then agreed upon between patient and the team that he will be discharged to follow-up at [facility] psychiatric clinic here in [place], [place] on 08/13/12 at 2:00 PM for counseling sessions. The address, time, date and contact information for this clinic provided for patient in writing.   Upon discharge, patient adamantly denies suicidal, homicidal ideations, auditory, visual hallucinations, delusional thoughts, paranoia and or withdrawal symptoms. Patient left [facility] with all personal belongings in no apparent distress. She received 2 weeks worth supply samples of her [facility] discharged medications, including a 30 days worth prescriptions for all her discharge medications. Transportation per patient arrangement.  Consults:  psychiatry  Significant Diagnostic Studies: CBC with diff, CMP, UDS, Toxicology tests, U/A  Discharge  Vitals:   Blood pressure 133/70, pulse 77, temperature 97.7 F (36.5 C), temperature source Oral, resp. rate 18, height 5\' 3"  (1.6 m), weight 62.143 kg (137 lb), last menstrual period 07/28/2012. Body mass index is 24.27 kg/(m^2). Lab Results:   Results for orders placed during the hospital encounter of 07/30/12 (from the past 72 hour(s))  URINALYSIS, ROUTINE W REFLEX MICROSCOPIC     Status: Abnormal   Collection Time    08/02/12  2:31 PM      Result Value Range   Color, Urine YELLOW  YELLOW   APPearance CLOUDY (*) CLEAR   Specific Gravity, Urine 1.012  1.005 - 1.030   pH 6.5  5.0 - 8.0   Glucose, UA NEGATIVE  NEGATIVE mg/dL   Hgb urine dipstick LARGE (*) NEGATIVE   Bilirubin Urine NEGATIVE  NEGATIVE   Ketones, ur NEGATIVE  NEGATIVE mg/dL   Protein, ur NEGATIVE  NEGATIVE mg/dL   Urobilinogen, UA 0.2  0.0 - 1.0 mg/dL   Nitrite NEGATIVE  NEGATIVE   Leukocytes, UA MODERATE (*) NEGATIVE  URINE MICROSCOPIC-ADD ON     Status: Abnormal   Collection Time    08/02/12  2:31 PM      Result Value Range   Squamous Epithelial / LPF FEW (*) RARE   WBC, UA 11-20  <3 WBC/hpf    Physical Findings: AIMS: Facial and Oral Movements Muscles of Facial Expression: None, normal Lips and Perioral Area: None, normal Jaw: None, normal Tongue: None, normal,Extremity Movements  Upper (arms, wrists, hands, fingers): None, normal Lower (legs, knees, ankles, toes): None, normal, Trunk Movements Neck, shoulders, hips: None, normal, Overall Severity Severity of abnormal movements (highest score from questions above): None, normal Incapacitation due to abnormal movements: None, normal Patient's awareness of abnormal movements (rate only patient's report): No Awareness, Dental Status Current problems with teeth and/or dentures?: No Does patient usually wear dentures?: No  CIWA:  CIWA-Ar Total: 0 COWS:  COWS Total Score: 0  Psychiatric Specialty Exam: See Psychiatric Specialty Exam and Suicide Risk  Assessment completed by Attending Physician prior to discharge.  Discharge destination:  Home  Is patient on multiple antipsychotic therapies at discharge:  No   Has Patient had three or more failed trials of antipsychotic monotherapy by history:  No  Recommended Plan for Multiple Antipsychotic Therapies: NA     Medication List    STOP taking these medications       HYDROcodone-acetaminophen 10-325 MG per tablet  Commonly known as:  NORCO     multivitamin with minerals Tabs     VIIBRYD 40 MG Tabs  Generic drug:  Vilazodone HCl      TAKE these medications     Indication   lamoTRIgine 25 MG tablet  Commonly known as:  LAMICTAL  Take 2 tablets (50 mg total) by mouth daily. For mood stabilization   Indication:  Depression, Depressive Phase of Manic-Depression     QUEtiapine 50 MG tablet  Commonly known as:  SEROQUEL  Take 1 tablet (50 mg total) by mouth 3 (three) times daily as needed (anxiety). For anxiety/mood control   Indication:  Mood control     traZODone 50 MG tablet  Commonly known as:  DESYREL  Take 1 tablet (50 mg total) by mouth at bedtime and may repeat dose one time if needed. For sleep   Indication:  Trouble Sleeping       Follow-up Information   Follow up with [facility] On 08/13/2012. (Appt with therapist and psychiatrist at 2:00PM)    Contact information:   [address], [place] [address] phone: [phone] fax: [phone]     Follow-up recommendations: Activity:  As tolerated Diet: As recommended by your primary care doctor. Keep all scheduled follow-up appointments as recommended.   Continue to work your relapse prevention plan Comments:  Take all your medications as prescribed by your mental healthcare provider. Report any adverse effects and or reactions from your medicines to your outpatient provider promptly. Patient is instructed and cautioned to not engage in alcohol and or illegal drug use while on prescription medicines. In the  event of worsening symptoms, patient is instructed to call the crisis hotline, 911 and or go to the nearest ED for appropriate evaluation and treatment of symptoms. Follow-up with your primary care provider for your other medical issues, concerns and or health care needs.   Total Discharge Time:  Greater than 30 minutes.  Signed: Sanjuana Kava, FNP, PMHNP-BC 08/04/2012, 9:46 AM Agree with the assessment and plan Madie Reno A. Dub Mikes, M.D.

## 2012-08-04 NOTE — [facility] Suicide Risk Assessment (Signed)
Suicide Risk Assessment  Discharge Assessment     Demographic Factors:  Caucasian  Mental Status Per Nursing Assessment::   On Admission:  Suicidal ideation indicated by patient  Current Mental Status by Physician: In full contact with reality. There are no suicidal ideas, plans or intent. Her mood is euthymic, her affect is appropriates. She is willing and motivated to pursue further outpatient treatment. She is going to AA and will come to [facility]   Loss Factors: NA  Historical Factors: NA  Risk Reduction Factors:   Sense of responsibility to family, Employed, Living with another person, especially a relative and Positive social support  Continued Clinical Symptoms:  Bipolar Disorder:   Depressive phase Alcohol/Substance Abuse/Dependencies  Cognitive Features That Contribute To Risk: No evidence     Suicide Risk:  Minimal: No identifiable suicidal ideation.  Patients presenting with no risk factors but with morbid ruminations; may be classified as minimal risk based on the severity of the depressive symptoms  Discharge Diagnoses:   AXIS I:  Polysubstance Dependence including opioids, Depression Bipolar Type AXIS II:  Deferred AXIS III:   Past Medical History  Diagnosis Date  . No pertinent past medical history   . Lumbar herniated disc 2011    L5  . Depression   . Anxiety    AXIS IV:  other psychosocial or environmental problems AXIS V:  61-70 mild symptoms  Plan Of Care/Follow-up recommendations:  Activity:  as tolerated Diet:  regular Follow up [facility] IOP/AA Is patient on multiple antipsychotic therapies at discharge:  No   Has Patient had three or more failed trials of antipsychotic monotherapy by history:  No  Recommended Plan for Multiple Antipsychotic Therapies:N/A   Joniece Smotherman A 08/04/2012, 11:16 AM

## 2012-08-04 NOTE — Progress Notes (Signed)
Adult Psychoeducational Group Note  Date:  08/04/2012 Time:  11:31 AM  Group Topic/Focus:  Recovery Goals:   The focus of this group is to identify appropriate goals for recovery and establish a plan to achieve them.  Participation Level:  Active  Participation Quality:  Appropriate and Sharing  Affect:  Appropriate  Cognitive:  Appropriate  Insight: Appropriate  Engagement in Group:  Developing/Improving  Modes of Intervention:  Activity and Discussion  Additional Comments:  Patient was appropriate and participated actively during group. Patient was able to share multiple experiences.   Lyndee Hensen 08/04/2012, 11:31 AM

## 2012-08-04 NOTE — Progress Notes (Signed)
[facility] Adult Case Management Discharge Plan :  Will you be returning to the same living situation after discharge: No. At discharge, do you have transportation home?:Yes,  friend Do you have the ability to pay for your medications:Yes,  mental health  Release of information consent forms completed and in the chart;  Patient's signature needed at discharge.  Patient to Follow up at: Follow-up Information   Follow up with [facility] On 08/13/2012. (Appt with therapist and psychiatrist at 2:00PM)    Contact information:   [address], [place] [address] phone: [phone] fax: [phone]      Patient denies SI/HI:   Yes,  in group    Safety Planning and Suicide Prevention discussed:  Yes,  SPE completed with pt's mother. PT provided with SPI pamphlet  Sims, Alexa Viele 08/04/2012, 9:45 AM

## 2012-08-07 NOTE — Progress Notes (Signed)
Patient Discharge Instructions:  After Visit Summary (AVS):   Faxed to:  08/07/12 Discharge Summary Note:   Faxed to:  08/07/12 Psychiatric Admission Assessment Note:   Faxed to:  08/07/12 Suicide Risk Assessment - Discharge Assessment:   Faxed to:  08/07/12 Faxed/Sent to the Next Level Care provider:  08/07/12 Faxed to [facility] @ [phone]  Jerelene Redden, 08/07/2012, 2:22 PM

## 2012-09-27 ENCOUNTER — Encounter (HOSPITAL_COMMUNITY): Payer: Self-pay | Admitting: *Deleted

## 2012-09-27 ENCOUNTER — Emergency Department (HOSPITAL_COMMUNITY)
Admission: EM | Admit: 2012-09-27 | Discharge: 2012-09-29 | Disposition: A | Discharge status: Home / Self Care | Payer: Self-pay | Attending: Emergency Medicine | Admitting: Emergency Medicine

## 2012-09-27 DIAGNOSIS — Z3202 Encounter for pregnancy test, result negative: Secondary | ICD-10-CM | POA: Insufficient documentation

## 2012-09-27 DIAGNOSIS — F3289 Other specified depressive episodes: Secondary | ICD-10-CM | POA: Insufficient documentation

## 2012-09-27 DIAGNOSIS — F411 Generalized anxiety disorder: Secondary | ICD-10-CM | POA: Insufficient documentation

## 2012-09-27 DIAGNOSIS — F329 Major depressive disorder, single episode, unspecified: Secondary | ICD-10-CM | POA: Insufficient documentation

## 2012-09-27 DIAGNOSIS — F1120 Opioid dependence, uncomplicated: Secondary | ICD-10-CM | POA: Insufficient documentation

## 2012-09-27 DIAGNOSIS — F1920 Other psychoactive substance dependence, uncomplicated: Secondary | ICD-10-CM

## 2012-09-27 DIAGNOSIS — Z8739 Personal history of other diseases of the musculoskeletal system and connective tissue: Secondary | ICD-10-CM | POA: Insufficient documentation

## 2012-09-27 DIAGNOSIS — F17200 Nicotine dependence, unspecified, uncomplicated: Secondary | ICD-10-CM | POA: Insufficient documentation

## 2012-09-27 HISTORY — DX: Panic disorder (episodic paroxysmal anxiety): F41.0

## 2012-09-27 LAB — COMPREHENSIVE METABOLIC PANEL
Albumin: 3.5 g/dL (ref 3.5–5.2)
Alkaline Phosphatase: 54 U/L (ref 39–117)
BUN: 11 mg/dL (ref 6–23)
Calcium: 9.6 mg/dL (ref 8.4–10.5)
Potassium: 3.3 mEq/L — ABNORMAL LOW (ref 3.5–5.1)
Sodium: 136 mEq/L (ref 135–145)
Total Protein: 7 g/dL (ref 6.0–8.3)

## 2012-09-27 LAB — CBC
HCT: 37 % (ref 36.0–46.0)
MCHC: 33.5 g/dL (ref 30.0–36.0)
RDW: 13.9 % (ref 11.5–15.5)

## 2012-09-27 LAB — RAPID URINE DRUG SCREEN, HOSP PERFORMED
Barbiturates: NOT DETECTED
Benzodiazepines: NOT DETECTED
Cocaine: NOT DETECTED
Opiates: POSITIVE — AB

## 2012-09-27 LAB — ACETAMINOPHEN LEVEL: Acetaminophen (Tylenol), Serum: <15 ug/mL (ref 10–30)

## 2012-09-27 LAB — ETHANOL: Alcohol, Ethyl (B): <11 mg/dL (ref 0–11)

## 2012-09-27 NOTE — ED Notes (Signed)
Pt states she has used heroin 3 times this week and has overdosed once this week, recent medicine has made her feel worse, Denies SI/HI Pt wants help with medicines

## 2012-09-28 DIAGNOSIS — F1910 Other psychoactive substance abuse, uncomplicated: Secondary | ICD-10-CM

## 2012-09-28 DIAGNOSIS — F3160 Bipolar disorder, current episode mixed, unspecified: Secondary | ICD-10-CM

## 2012-09-28 DIAGNOSIS — F259 Schizoaffective disorder, unspecified: Secondary | ICD-10-CM

## 2012-09-28 MED ORDER — LAMOTRIGINE 25 MG PO TABS
50.0000 mg | ORAL_TABLET | Freq: Every day | ORAL | Status: DC
  Filled 2012-09-28: qty 2

## 2012-09-28 MED ORDER — NICOTINE 21 MG/24HR TD PT24
21.0000 mg | MEDICATED_PATCH | Freq: Once | TRANSDERMAL | Status: DC
  Administered 2012-09-28: 21 mg via TRANSDERMAL
  Filled 2012-09-28: qty 1

## 2012-09-28 MED ORDER — ONDANSETRON HCL 4 MG PO TABS
4.0000 mg | ORAL_TABLET | Freq: Three times a day (TID) | ORAL | Status: DC | PRN
  Administered 2012-09-28: 4 mg via ORAL
  Filled 2012-09-28: qty 1

## 2012-09-28 MED ORDER — QUETIAPINE FUMARATE ER 50 MG PO TB24
150.0000 mg | ORAL_TABLET | Freq: Every day | ORAL | Status: DC
  Administered 2012-09-28 (×2): 150 mg via ORAL
  Filled 2012-09-28 (×4): qty 3

## 2012-09-28 MED ORDER — ZIPRASIDONE MESYLATE 20 MG IM SOLR
10.0000 mg | Freq: Once | INTRAMUSCULAR | Status: AC
  Administered 2012-09-29: 10 mg via INTRAMUSCULAR
  Filled 2012-09-28: qty 20

## 2012-09-28 MED ORDER — LORAZEPAM 1 MG PO TABS
1.0000 mg | ORAL_TABLET | Freq: Three times a day (TID) | ORAL | Status: DC | PRN
  Administered 2012-09-28: 1 mg via ORAL
  Filled 2012-09-28 (×2): qty 1

## 2012-09-28 MED ORDER — TRAZODONE HCL 50 MG PO TABS: 50.0000 mg | ORAL_TABLET | Freq: Every evening | ORAL | Status: DC | PRN

## 2012-09-28 MED ORDER — ALUM & MAG HYDROXIDE-SIMETH 200-200-20 MG/5ML PO SUSP: 30.0000 mL | ORAL | Status: DC | PRN

## 2012-09-28 MED ORDER — LAMOTRIGINE 25 MG PO TABS
25.0000 mg | ORAL_TABLET | Freq: Two times a day (BID) | ORAL | Status: DC
  Administered 2012-09-28 (×2): 25 mg via ORAL
  Filled 2012-09-28 (×5): qty 1

## 2012-09-28 MED ORDER — TRAZODONE HCL 50 MG PO TABS
50.0000 mg | ORAL_TABLET | Freq: Every day | ORAL | Status: DC
  Administered 2012-09-28 (×2): 50 mg via ORAL
  Filled 2012-09-28 (×2): qty 1

## 2012-09-28 MED ORDER — LORAZEPAM 1 MG PO TABS
1.0000 mg | ORAL_TABLET | Freq: Once | ORAL | Status: AC
  Administered 2012-09-28: 1 mg via ORAL

## 2012-09-28 MED ORDER — ACETAMINOPHEN 325 MG PO TABS: 650.0000 mg | ORAL_TABLET | ORAL | Status: DC | PRN

## 2012-09-28 NOTE — ED Provider Notes (Signed)
Medical screening examination/treatment/procedure(s) were performed by non-physician practitioner and as supervising physician I was immediately available for consultation/collaboration.  Sunnie Nielsen, MD 09/28/12 [phone]

## 2012-09-28 NOTE — BH Assessment (Signed)
Assessment Note  Alexa Sims is an 42 y.o. female. Pt presents to [facility] after relapsing on heroin.  Pt reports she was sober nearly 60 days but has been in conflict with her husband recently and began using again.  Pt reports daily use of heroin over the past week with some withdrawal symptoms.  Pt reports some depression but denies SI/HI/AV.  Pt reports that Dr Dub Mikes diagnosed her with bipolar disorder and she feels like her medications are not working: she is having trouble concentrating and also has had very up and down moods.  Axis I: opioide dependence Axis II: Deferred Axis III:  Past Medical History  Diagnosis Date  . No pertinent past medical history   . Lumbar herniated disc 2011    L5  . Depression   . Anxiety   . Panic attack    Axis IV: problems with primary support group Axis V: 41-50 serious symptoms  Past Medical History:  Past Medical History  Diagnosis Date  . No pertinent past medical history   . Lumbar herniated disc 2011    L5  . Depression   . Anxiety   . Panic attack     Past Surgical History  Procedure Laterality Date  . Cholecystectomy    . Knee surgery      partial meniscectomy right knee  . Cesarean section  06/07/2011    Procedure: CESAREAN SECTION;  Surgeon: Loney Laurence, MD;  Location: [facility] ORS;  Service: Gynecology;  Laterality: N/A;    Family History:  Family History  Problem Relation Age of Onset  . Hyperlipidemia Father   . Hypertension Father   . Heart attack Father   . Rheum arthritis Sister   . Sudden death Paternal Uncle   . Hypertension Maternal Grandmother   . Diabetes Neg Hx   . Anesthesia problems Neg Hx   . Birth defects Brother     Social History:  reports that she has been smoking Cigarettes.  She has a 15 pack-year smoking history. She has never used smokeless tobacco. She reports that she uses illicit drugs (Cocaine, Heroin, Hydrocodone, Benzodiazepines, and Opium). She reports that she does not drink  alcohol.  Additional Social History:  Alcohol / Drug Use Pain Medications: heroin Prescriptions: pt denies Over the Counter: pt denies History of alcohol / drug use?: Yes Longest period of sobriety (when/how long): 55 days Negative Consequences of Use: Personal relationships Substance #1 Name of Substance 1: heroin 1 - Age of First Use: 42 1 - Amount (size/oz): $20-120 1 - Frequency: daily 1 - Duration: past 6 days 1 - Last Use / Amount: 8/31, 1 gram,last use 3pm  CIWA: CIWA-Ar BP: 103/76 mmHg Pulse Rate: 78 COWS: Clinical Opiate Withdrawal Scale (COWS) Resting Pulse Rate: Pulse Rate 80 or below Sweating: No report of chills or flushing Restlessness: Able to sit still Pupil Size: Pupils pinned or normal size for room light Bone or Joint Aches: Mild diffuse discomfort Runny Nose or Tearing: Nasal stuffiness or unusually moist eyes GI Upset: No GI symptoms Tremor: No tremor Yawning: No yawning Anxiety or Irritability: Patient obviously irritable/anxious Gooseflesh Skin: Skin is smooth COWS Total Score: 4  Allergies:  Allergies  Allergen Reactions  . Aspirin     Bothers stomach  . Tramadol Nausea And Vomiting and Other (See Comments)    Blurred vision.    Home Medications:  (Not in a hospital admission)  OB/GYN Status:  Patient's last menstrual period was 09/13/2012.  General Assessment Data Location  of Assessment: [facility] ED ACT Assessment: Yes Is this a Tele or Face-to-Face Assessment?: Face-to-Face Is this an Initial Assessment or a Re-assessment for this encounter?: Initial Assessment Living Arrangements: Spouse/significant other;Children Can pt return to current living arrangement?: Yes Admission Status: Voluntary Is patient capable of signing voluntary admission?: Yes Transfer from: Acute Hospital Referral Source: Self/Family/Friend     [facility] Crisis Care Plan Living Arrangements: Spouse/significant other;Children Name of Psychiatrist: Dr Eulah Pont, [place] [facility]      Risk to self Suicidal Ideation: No-Not Currently/Within Last 6 Months (Pt hospitalized for SI 07/29/12 at [facility].) Suicidal Intent: No-Not Currently/Within Last 6 Months Is patient at risk for suicide?: No Suicidal Plan?: No Access to Means: No What has been your use of drugs/alcohol within the last 12 months?: recent relapse on heroin Previous Attempts/Gestures: Yes (per previous assessment) How many times?: 3 Intentional Self Injurious Behavior: None (previous assessment reports yes) Family Suicide History: No Recent stressful life event(s): Conflict (Comment);Loss (Comment) (with husband, recent move, death of friend) Persecutory voices/beliefs?: No Depression: Yes Depression Symptoms: Tearfulness;Fatigue;Guilt Substance abuse history and/or treatment for substance abuse?: Yes Suicide prevention information given to non-admitted patients: Not applicable  Risk to Others Homicidal Ideation: No Thoughts of Harm to Others: No Current Homicidal Intent: No Current Homicidal Plan: No Access to Homicidal Means: No History of harm to others?: No Assessment of Violence: None Noted Does patient have access to weapons?: No Criminal Charges Pending?: Yes Describe Pending Criminal Charges: traffic related Does patient have a court date: Yes Court Date: 10/09/12  Psychosis Hallucinations: None noted Delusions: None noted  Mental Status Report Appear/Hygiene: Other (Comment) (casual) Eye Contact: Good Motor Activity: Restlessness Speech: Logical/coherent Level of Consciousness: Alert Mood: Anxious Affect: Appropriate to circumstance Anxiety Level: Moderate Thought Processes: Coherent;Relevant Judgement: Unimpaired Orientation: Person;Place;Time;Situation Obsessive Compulsive Thoughts/Behaviors: None  Cognitive Functioning Concentration: Normal Memory: Recent Intact;Remote Intact IQ: Average Insight: Good Impulse Control: Fair Appetite: Good Weight Loss: 0 Weight Gain:  6 Sleep: Decreased Total Hours of Sleep: 6 Vegetative Symptoms: None  ADLScreening [facility] Assessment Services) Patient's cognitive ability adequate to safely complete daily activities?: Yes Patient able to express need for assistance with ADLs?: Yes Independently performs ADLs?: Yes (appropriate for developmental age)  Prior Inpatient Therapy Prior Inpatient Therapy: Yes Prior Therapy Dates: 07/2012 Prior Therapy Facilty/Provider(s): [facility] Reason for Treatment: psych/detox  Prior Outpatient Therapy Prior Outpatient Therapy: No  ADL Screening (condition at time of admission) Patient's cognitive ability adequate to safely complete daily activities?: Yes Patient able to express need for assistance with ADLs?: Yes Independently performs ADLs?: Yes (appropriate for developmental age)       Abuse/Neglect Assessment (Assessment to be complete while patient is alone) Physical Abuse: Denies Verbal Abuse: Denies Sexual Abuse: Denies Exploitation of patient/patient's resources: Denies Self-Neglect: Denies Values / Beliefs Cultural Requests During Hospitalization: None Spiritual Requests During Hospitalization: None   Advance Directives (For Healthcare) Advance Directive: Patient does not have advance directive;Patient would like information Patient requests advance directive information: Advance directive packet given    Additional Information 1:1 In Past 12 Months?: No CIRT Risk: No Elopement Risk: No Does patient have medical clearance?: Yes     Disposition:  Disposition Initial Assessment Completed for this Encounter: Yes Disposition of Patient: Other dispositions  On Site Evaluation by:   Reviewed with Physician:    Lorri Frederick 09/28/2012 1:41 AM

## 2012-09-28 NOTE — ED Notes (Signed)
Patients wallet and phone locked up with security in lockbox #8

## 2012-09-28 NOTE — ED Notes (Signed)
Counselor in with pt. Will move to 15 when finished.
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
## 2012-09-28 NOTE — ED Provider Notes (Signed)
CSN: [id number]     Arrival date & time 09/27/12  2155 History   First MD Initiated Contact with Patient 09/28/12 0000     Chief Complaint  Patient presents with  . Medical Clearance   (Consider location/radiation/quality/duration/timing/severity/associated sxs/prior Treatment) HPI Comments: Patient presents today requesting detox from Heroin and inpatient treatment.  She reports that had been through a sixty day sobriety program, but one week ago began using once again  She states that she has been using Heroin daily over the past week.  She denies SI or HI.  Denies alcohol use.  She is currently being treated for Bipolar with Lamictal and Seroquel, but does not feel that it is helping.  She denies any physical symptoms at this time.  The history is provided by the patient.    Past Medical History  Diagnosis Date  . No pertinent past medical history   . Lumbar herniated disc 2011    L5  . Depression   . Anxiety   . Panic attack    Past Surgical History  Procedure Laterality Date  . Cholecystectomy    . Knee surgery      partial meniscectomy right knee  . Cesarean section  06/07/2011    Procedure: CESAREAN SECTION;  Surgeon: Loney Laurence, MD;  Location: [facility];  Service: Gynecology;  Laterality: N/A;   Family History  Problem Relation Age of Onset  . Hyperlipidemia Father   . Hypertension Father   . Heart attack Father   . Rheum arthritis Sister   . Sudden death Paternal Uncle   . Hypertension Maternal Grandmother   . Diabetes Neg Hx   . Anesthesia problems Neg Hx   . Birth defects Brother    History  Substance Use Topics  . Smoking status: Current Every Day Smoker -- 1.00 packs/day for 15 years    Types: Cigarettes  . Smokeless tobacco: Never Used  . Alcohol Use: No   OB History   Grav Para Term Preterm Abortions TAB SAB Ect Mult Living   3 3 3  0 0 0 0 0 0 3     Review of Systems  All other systems reviewed and are negative.    Allergies  Aspirin and  Tramadol  Home Medications   Current Outpatient Rx  Name  Route  Sig  Dispense  Refill  . lamoTRIgine (LAMICTAL) 25 MG tablet   Oral   Take 2 tablets (50 mg total) by mouth daily. For mood stabilization   30 tablet   0   . QUEtiapine Fumarate (SEROQUEL XR) 150 MG 24 hr tablet   Oral   Take 150 mg by mouth at bedtime.         . traZODone (DESYREL) 50 MG tablet   Oral   Take 1 tablet (50 mg total) by mouth at bedtime and may repeat dose one time if needed. For sleep   60 tablet   0    BP 103/76  Pulse 78  Temp(Src) 98.2 F (36.8 C) (Oral)  Resp 20  SpO2 100%  LMP 09/13/2012 Physical Exam  Nursing note and vitals reviewed. Constitutional: She appears well-developed and well-nourished.  HENT:  Head: Normocephalic and atraumatic.  Mouth/Throat: Oropharynx is clear and moist.  Neck: Normal range of motion. Neck supple.  Cardiovascular: Normal rate, regular rhythm and normal heart sounds.   Pulmonary/Chest: Effort normal and breath sounds normal.  Neurological: She is alert.  Skin: Skin is warm and dry.  Psychiatric: She has a  normal mood and affect.    ED Course  Procedures (including critical care time) Labs Review Labs Reviewed  COMPREHENSIVE METABOLIC PANEL - Abnormal; Notable for the following:    Potassium 3.3 (*)    AST 41 (*)    ALT 64 (*)    Total Bilirubin 0.2 (*)    GFR calc non Af Amer 68 (*)    GFR calc Af Amer 79 (*)    All other components within normal limits  SALICYLATE LEVEL - Abnormal; Notable for the following:    Salicylate Lvl <2.0 (*)    All other components within normal limits  URINE RAPID DRUG SCREEN (HOSP PERFORMED) - Abnormal; Notable for the following:    Opiates POSITIVE (*)    All other components within normal limits  ACETAMINOPHEN LEVEL  CBC  ETHANOL  POCT PREGNANCY, URINE   Imaging Review No results found.  MDM  No diagnosis found. Patient presenting requesting detox from Heroin.  Patient denies SI or HI.  VSS.  No  physical complaints at this time.  Patient has been evaluated by ACT team.  Psych holding orders have been placed.    Pascal Lux Lowell, PA-C 09/28/12 [phone]

## 2012-09-29 ENCOUNTER — Encounter (HOSPITAL_COMMUNITY): Payer: Self-pay | Admitting: Psychiatry

## 2012-09-29 DIAGNOSIS — F1110 Opioid abuse, uncomplicated: Secondary | ICD-10-CM

## 2012-09-29 MED ORDER — ALPRAZOLAM 0.5 MG PO TABS
1.0000 mg | ORAL_TABLET | Freq: Once | ORAL | Status: AC
  Administered 2012-09-29: 1 mg via ORAL
  Filled 2012-09-29: qty 2

## 2012-09-29 MED ORDER — POTASSIUM CHLORIDE CRYS ER 20 MEQ PO TBCR
20.0000 meq | EXTENDED_RELEASE_TABLET | Freq: Once | ORAL | Status: AC
  Administered 2012-09-29: 20 meq via ORAL
  Filled 2012-09-29: qty 2

## 2012-09-29 MED ORDER — DIPHENHYDRAMINE HCL 25 MG PO CAPS
25.0000 mg | ORAL_CAPSULE | Freq: Once | ORAL | Status: AC
  Administered 2012-09-29: 25 mg via ORAL

## 2012-09-29 MED ORDER — DIPHENHYDRAMINE HCL 25 MG PO CAPS
ORAL_CAPSULE | ORAL | Status: AC
  Administered 2012-09-29: 25 mg via ORAL
  Filled 2012-09-29: qty 1

## 2012-09-29 NOTE — ED Notes (Signed)
Pt didn't wait in room for discharge paperwork.  I went outside ED and found pt sitting on bench waiting for her ride and gave her her discharge paperwork and went over it with her.

## 2012-09-29 NOTE — Consult Note (Signed)
Patient complained of not sleeping last night due to her chronic back pain issue and her "bones hurt" from heroin withdrawal with some chills.  She was using almost a gram daily until yesterday.  Appetite is "good", 5/10 depression with no suicidal/homicidal ideations, no auditory/visual hallucinations.  Ms. Truxillo states if she leaves today she will go back to the streets and continue using heroin.  When asked about her court date today and on the 9th of September, she denied the court date today and stated the one on the 9th is traffic related.  Patient was denied at [facility] and [facility] because of her court dates on 9/2 and 9/9.  Recommend she is discharged so she can attend her court date today and on the 9th and then seek rehab at [facility] or [facility].

## 2012-09-29 NOTE — Progress Notes (Addendum)
Per discussion with Psychiatric NP, patient psychiatrically stable for discharge home. CSW met with pt to provide outpatient resources. Pt declined from [facility] and [facility] due to court dates. Patient does not meet inpatient crieteria for detox from [facility]. Patient requested her belongings to begin calling family and friends to assist with transportation home. RN and EDP aware. .No further Clinical Social Work needs, signing off.    Frutoso Schatz [phone]  ED CSW 09/29/2012 9:03am

## 2012-09-29 NOTE — Progress Notes (Signed)
B.Emelyn Roen, MHT was requested to seek placement for 42 year old female who is seeking detox from Heroin use. Patient has a pending placement at [facility] on [address] when bed comes available Writer has been requested to seek placement in other facilities. Writer contacted [facility] and [facility] in efforts to seek placement, both facilities have declined patient due to current pending court dates. Patient has a court date scheduled for 09/29/12 and another on 10/06/12.

## 2012-09-30 NOTE — Progress Notes (Signed)
CSW received call that patient needed referral from [facility]. CSW spoke with Vernona Rieger at [facility] who stated that patient permanent mailing address is [address] in [place], pt would need referral from hosptial [place] or from [place]. Per [facility] this writer could not send referral now as pt not currently a patient. CSW called pt back with information. Pt stated, that she would call [facility] back and explain that she no longer lives in [place].   Catha Gosselin, [place] [phone]  ED CSW 09/30/2012 1443pm

## 2012-10-01 NOTE — Consult Note (Signed)
Note reviewed and agreed with  

## 2013-05-26 ENCOUNTER — Emergency Department
Admission: EM | Admit: 2013-05-26 | Discharge: 2013-05-26 | Disposition: A | Discharge status: Home / Self Care | Payer: Self-pay | Source: Home / Self Care | Attending: Family Medicine | Admitting: Family Medicine

## 2013-05-26 ENCOUNTER — Encounter: Payer: Self-pay | Admitting: Emergency Medicine

## 2013-05-26 DIAGNOSIS — L600 Ingrowing nail: Secondary | ICD-10-CM

## 2013-05-26 MED ORDER — DOXYCYCLINE HYCLATE 100 MG PO CAPS: 100.0000 mg | ORAL_CAPSULE | Freq: Two times a day (BID) | ORAL | Status: DC

## 2013-05-26 MED ORDER — HYDROCODONE-ACETAMINOPHEN 5-325 MG PO TABS: ORAL_TABLET | ORAL | Status: DC

## 2013-05-26 MED ORDER — CICLOPIROX 8 % EX SOLN: Freq: Every day | CUTANEOUS | Status: DC

## 2013-05-26 NOTE — ED Notes (Signed)
Right great toe ingrown x 1 week

## 2013-05-26 NOTE — Discharge Instructions (Signed)

## 2013-05-26 NOTE — ED Provider Notes (Signed)
CSN: [id number]     Arrival date & time 05/26/13  1424 History   First MD Initiated Contact with Patient 05/26/13 1447     Chief Complaint  Patient presents with  . Ingrown Toenail      HPI Comments: Patient complains of ingrown right great toenail for one week.  Patient is a 43 y.o. female presenting with toe pain. The history is provided by the patient.  Toe Pain This is a new problem. Episode onset: 1 week. The problem occurs constantly. The problem has been gradually worsening. The symptoms are aggravated by walking. Nothing relieves the symptoms. Treatments tried: trimming toenail. The treatment provided no relief.    Past Medical History  Diagnosis Date  . No pertinent past medical history   . Lumbar herniated disc 2011    L5  . Depression   . Anxiety   . Panic attack    Past Surgical History  Procedure Laterality Date  . Cholecystectomy    . Knee surgery      partial meniscectomy right knee  . Cesarean section  06/07/2011    Procedure: CESAREAN SECTION;  Surgeon: Loney LaurenceMichelle A Horvath, MD;  Location: [facility];  Service: Gynecology;  Laterality: N/A;   Family History  Problem Relation Age of Onset  . Hyperlipidemia Father   . Hypertension Father   . Heart attack Father   . Rheum arthritis Sister   . Sudden death Paternal Uncle   . Hypertension Maternal Grandmother   . Diabetes Neg Hx   . Anesthesia problems Neg Hx   . Birth defects Brother    History  Substance Use Topics  . Smoking status: Current Every Day Smoker -- 1.00 packs/day for 15 years    Types: Cigarettes  . Smokeless tobacco: Never Used  . Alcohol Use: 0.5 oz/week    1 drink(s) per week   OB History   Grav Para Term Preterm Abortions TAB SAB Ect Mult Living   3 3 3  0 0 0 0 0 0 3     Review of Systems  All other systems reviewed and are negative.   Allergies  Aspirin and Tramadol  Home Medications   Prior to Admission medications   Medication Sig Start Date End Date Taking? Authorizing  Provider  lamoTRIgine (LAMICTAL) 25 MG tablet Take 2 tablets (50 mg total) by mouth daily. For mood stabilization 08/04/12   Sanjuana KavaAgnes I Nwoko, NP  QUEtiapine Fumarate (SEROQUEL XR) 150 MG 24 hr tablet Take 150 mg by mouth at bedtime.    Historical Provider, MD  traZODone (DESYREL) 50 MG tablet Take 1 tablet (50 mg total) by mouth at bedtime and may repeat dose one time if needed. For sleep 08/04/12   Sanjuana KavaAgnes I Nwoko, NP   BP 126/84  Temp(Src) 97.7 F (36.5 C) (Oral)  Ht 5\' 1"  (1.549 m)  Wt 147 lb (66.679 kg)  BMI 27.79 kg/m2  SpO2 100% Physical Exam  Nursing note and vitals reviewed. Constitutional: She is oriented to person, place, and time. She appears well-developed and well-nourished. No distress.  HENT:  Head: Normocephalic.  Eyes: Conjunctivae are normal. Pupils are equal, round, and reactive to light.  Musculoskeletal:       Right foot: She exhibits tenderness and swelling. She exhibits normal range of motion, no bony tenderness and normal capillary refill.       Feet:  Lateral edge of right great toenail is mildly swollen and tender to palpation.  No purulent discharge noted.  Toenail is thickened  and somewhat deformed distally.  Neurological: She is alert and oriented to person, place, and time.  Skin: Skin is warm and dry.    ED Course  Procedures  Procedure:  Partial toenail excision Explained benefits and risks of procedure and consent obtained.  With sterile technique and digital plain 0.25% bupivacaine anesthesia, gently lifted lateral edge of right great toenail from nail fold without difficulty (no need for partial nail excision).  Inserted small piece of 1/4inch packing beneath nail edge to maintain its position.  Wound precautions given.          MDM   1. Ingrown right greater toenail; suspect onychomycosis also.   Begin doxycycline for staph coverage.  Rx for PenLac nail lacquer.  Discussed proper care of toenails to prevent ingrowing.  Return for increased pain,  swelling, etc.    Lattie HawStephen A Niyam Bisping, MD 06/01/13 [phone]

## 2013-11-29 ENCOUNTER — Encounter: Payer: Self-pay | Admitting: Emergency Medicine

## 2016-09-16 ENCOUNTER — Encounter (HOSPITAL_BASED_OUTPATIENT_CLINIC_OR_DEPARTMENT_OTHER): Payer: Self-pay

## 2016-09-16 ENCOUNTER — Emergency Department (HOSPITAL_BASED_OUTPATIENT_CLINIC_OR_DEPARTMENT_OTHER)
Admission: EM | Admit: 2016-09-16 | Discharge: 2016-09-16 | Disposition: A | Discharge status: Home / Self Care | Payer: Self-pay | Attending: Emergency Medicine | Admitting: Emergency Medicine

## 2016-09-16 DIAGNOSIS — L299 Pruritus, unspecified: Secondary | ICD-10-CM

## 2016-09-16 DIAGNOSIS — F17210 Nicotine dependence, cigarettes, uncomplicated: Secondary | ICD-10-CM | POA: Insufficient documentation

## 2016-09-16 DIAGNOSIS — L0201 Cutaneous abscess of face: Secondary | ICD-10-CM | POA: Insufficient documentation

## 2016-09-16 DIAGNOSIS — L298 Other pruritus: Secondary | ICD-10-CM | POA: Insufficient documentation

## 2016-09-16 DIAGNOSIS — L0291 Cutaneous abscess, unspecified: Secondary | ICD-10-CM

## 2016-09-16 MED ORDER — CLINDAMYCIN HCL 150 MG PO CAPS: 450.0000 mg | ORAL_CAPSULE | Freq: Four times a day (QID) | ORAL | 0 refills | Status: AC

## 2016-09-16 MED ORDER — IBUPROFEN 400 MG PO TABS
600.0000 mg | ORAL_TABLET | Freq: Once | ORAL | Status: AC
  Administered 2016-09-16: 600 mg via ORAL
  Filled 2016-09-16: qty 1
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
## 2016-09-16 NOTE — Discharge Instructions (Signed)
Your evaluated in the ED for abscesses. Your forehead abscess was drained with a tiny incision to improve drainage and healing. Please take clindamycin as prescribed for infection.  For pain take 1000 mg tylenol + 400 mg ibuprofen every 8 hours. Warm compresses as often as possible to facilitate drainage.   Take an allergy medication daily to help with skin itching, any over-the-counter option is appropriate. Benadryl as needed and at bedtime to avoid night time scratching/itching of skin. Avoid any fragrance to or new topical soap/detergent/lotions in case any of these are causing skin itching.  Your boils should start to improve within 2 days of taking antibiotics. Monitor for increased redness, swelling, pain, fevers and return to the ED if these occur.

## 2016-09-16 NOTE — ED Triage Notes (Signed)
C/o abscess x 2 to face-x 1 to left axilla-NAD-steady gait

## 2016-09-16 NOTE — ED Notes (Signed)
Security standing outside room door, pt is now more calm.

## 2016-09-16 NOTE — ED Provider Notes (Signed)
[facility]   CSN: [id number] Arrival date & time: 09/16/16  1656     History   Chief Complaint Chief Complaint  Patient presents with  . Abscess    HPI Alexa Sims is a 46 y.o. female with documented history of depression, anxiety, polysubstance abuse presents to the ED for evaluation of four bumps to face, neck and left axilla x 3 days ago. Bumps are red, tender, warm. She expressed yellow discharge from one lesion. Patient denies fevers, chills, body aches. Denies exposure to insects. Was released from prison 2 weeks ago and has since been using different topical soap/creams. Reports her skin has been more itchy and she has been picking at her skin. No h/o abscesses in the past. States has not used IVD in two years.   HPI  Past Medical History:  Diagnosis Date  . Anxiety   . Depression   . Lumbar herniated disc 2011   L5  . No pertinent past medical history   . Panic attack     Patient Active Problem List   Diagnosis Date Noted  . Major depressive disorder, recurrent episode, moderate (HCC) 07/31/2012    Class: Acute  . Polysubstance dependence (HCC) 07/31/2012  . Polysubstance dependence including opioid type drug, continuous use (HCC) 07/31/2012  . Right ankle injury 09/11/2010  . Right knee injury 09/11/2010    Past Surgical History:  Procedure Laterality Date  . CESAREAN SECTION  06/07/2011   Procedure: CESAREAN SECTION;  Surgeon: Loney Laurence, MD;  Location: [facility];  Service: Gynecology;  Laterality: N/A;  . CHOLECYSTECTOMY    . KNEE SURGERY     partial meniscectomy right knee    OB History    Gravida Para Term Preterm AB Living   3 3 3  0 0 3   SAB TAB Ectopic Multiple Live Births   0 0 0 0 1       Home Medications    Prior to Admission medications   Medication Sig Start Date End Date Taking? Authorizing Provider  clindamycin (CLEOCIN) 150 MG capsule Take 3 capsules (450 mg total) by mouth every 6 (six) hours.  09/16/16 09/23/16  Liberty Handy, PA-C  lamoTRIgine (LAMICTAL) 25 MG tablet Take 2 tablets (50 mg total) by mouth daily. For mood stabilization 08/04/12   Sanjuana Kava, NP    Family History Family History  Problem Relation Age of Onset  . Birth defects Brother   . Hyperlipidemia Father   . Hypertension Father   . Heart attack Father   . Rheum arthritis Sister   . Sudden death Paternal Uncle   . Hypertension Maternal Grandmother   . Diabetes Neg Hx   . Anesthesia problems Neg Hx     Social History Social History  Substance Use Topics  . Smoking status: Current Every Day Smoker    Packs/day: 0.00    Years: 0.00    Types: Cigarettes  . Smokeless tobacco: Never Used  . Alcohol use 0.5 oz/week    1 Standard drinks or equivalent per week     Comment: occ     Allergies   Aspirin and Tramadol   Review of Systems Review of Systems  Constitutional: Positive for chills. Negative for fever.  HENT: Positive for facial swelling.   Respiratory: Negative for cough and shortness of breath.   Gastrointestinal: Negative for abdominal pain.  Neurological: Positive for headaches.     Physical Exam Updated Vital Signs BP 138/84 (BP Location: Left  Arm)   Pulse 77   Temp 98 F (36.7 C) (Oral)   Resp 18   Ht 5\' 3"  (1.6 m)   Wt 68.4 kg (150 lb 12.7 oz)   LMP 09/10/2016   SpO2 100%   BMI 26.71 kg/m   Physical Exam  Constitutional: She is oriented to person, place, and time. She appears well-developed and well-nourished. No distress.  NAD.  HENT:  Head: Normocephalic and atraumatic.  Right Ear: External ear normal.  Left Ear: External ear normal.  Nose: Nose normal.  Multiple scabbed circular lesions to chin, chest and neck, non tender without fluctuance or warmth One warmth, erythematous, fluctuant lesion to left forehead c/w abscess See picture  Eyes: Conjunctivae and EOM are normal. No scleral icterus.  Neck: Normal range of motion. Neck supple.  Cardiovascular:  Normal rate, regular rhythm and normal heart sounds.   No murmur heard. Pulmonary/Chest: Effort normal and breath sounds normal. She has no wheezes.  Musculoskeletal: Normal range of motion. She exhibits no deformity.  Neurological: She is alert and oriented to person, place, and time.  Skin: Skin is warm and dry. Capillary refill takes less than 2 seconds.  Psychiatric: She has a normal mood and affect. Her behavior is normal. Judgment and thought content normal.  Nursing note and vitals reviewed.        ED Treatments / Results  Labs (all labs ordered are listed, but only abnormal results are displayed) Labs Reviewed - No data to display  EKG  EKG Interpretation None       Radiology No results found.  Procedures .Marland KitchenIncision and Drainage Date/Time: 09/16/2016 6:49 PM Performed by: Liberty Handy Authorized by: Liberty Handy   Consent:    Consent obtained:  Verbal   Consent given by:  Patient   Risks discussed:  Bleeding, incomplete drainage, pain and infection   Alternatives discussed:  Alternative treatment Location:    Type:  Abscess   Size:  1 x 1 cm   Location:  Head   Head location:  Face Pre-procedure details:    Skin preparation:  Chloraprep Anesthesia (see MAR for exact dosages):    Anesthesia method:  None Procedure details:    Needle aspiration: no     Incision types:  Stab incision   Scalpel blade:  11   Drainage:  Bloody   Drainage amount:  Scant   Wound treatment:  Wound left open   Packing materials:  None Post-procedure details:    Patient tolerance of procedure:  Tolerated well, no immediate complications   (including critical care time)  Medications Ordered in ED Medications  ibuprofen (ADVIL,MOTRIN) tablet 600 mg (600 mg Oral Given 09/16/16 1847)     Initial Impression / Assessment and Plan / ED Course  I have reviewed the triage vital signs and the nursing notes.  Pertinent labs & imaging results that were available  during my care of the patient were reviewed by me and considered in my medical decision making (see chart for details).    46 year old female with documented history of polysubstance abuse, depression, anxiety presents to the ED for evaluation of boils to her chest, neck and face 3 days. She also reports pruritus to her chest and face. Was recently released from prison 2 weeks ago, has been using a topical soap/lotions since. Exam is consistent with with small abscesses, she has evidence of excoriation to her chest, neck and face most likely from recent new topical lotions. No exposure to ticks, plants  or animals. No one in household with similar symptoms. Left forehead abscess was drained with a tiny incision through the scab to facilitate draining. There was no significant purulent discharge. Given documented history of IV drug use, exposure to prison and multiple abscesses today patient will be discharged with clindamycin to cover MRSA, warm compresses, NSAIDs. Patient is aware that symptoms should improve within 2 days of starting antibiotics, she is aware symptoms that would warrant return to the ED for reevaluation. No indication for narcotic pain medication today.  Final Clinical Impressions(s) / ED Diagnoses   Final diagnoses:  Abscess  Skin pruritus    New Prescriptions New Prescriptions   CLINDAMYCIN (CLEOCIN) 150 MG CAPSULE    Take 3 capsules (450 mg total) by mouth every 6 (six) hours.     Liberty Handy, PA-C 09/16/16 1851    Charlynne Pander, MD 09/17/16 [phone]

## 2016-11-15 ENCOUNTER — Emergency Department (HOSPITAL_BASED_OUTPATIENT_CLINIC_OR_DEPARTMENT_OTHER): Payer: Self-pay

## 2016-11-15 ENCOUNTER — Encounter (HOSPITAL_BASED_OUTPATIENT_CLINIC_OR_DEPARTMENT_OTHER): Payer: Self-pay | Admitting: *Deleted

## 2016-11-15 ENCOUNTER — Inpatient Hospital Stay (HOSPITAL_BASED_OUTPATIENT_CLINIC_OR_DEPARTMENT_OTHER)
Admission: EM | Admit: 2016-11-15 | Discharge: 2016-11-17 | DRG: 603 | Disposition: A | Discharge status: Home / Self Care | Payer: Self-pay | Attending: Family Medicine | Admitting: Family Medicine

## 2016-11-15 DIAGNOSIS — L03213 Periorbital cellulitis: Principal | ICD-10-CM | POA: Diagnosis present

## 2016-11-15 DIAGNOSIS — H05012 Cellulitis of left orbit: Secondary | ICD-10-CM | POA: Diagnosis present

## 2016-11-15 DIAGNOSIS — F329 Major depressive disorder, single episode, unspecified: Secondary | ICD-10-CM | POA: Diagnosis present

## 2016-11-15 DIAGNOSIS — Z95828 Presence of other vascular implants and grafts: Secondary | ICD-10-CM

## 2016-11-15 DIAGNOSIS — F17210 Nicotine dependence, cigarettes, uncomplicated: Secondary | ICD-10-CM | POA: Diagnosis present

## 2016-11-15 DIAGNOSIS — B9561 Methicillin susceptible Staphylococcus aureus infection as the cause of diseases classified elsewhere: Secondary | ICD-10-CM | POA: Diagnosis present

## 2016-11-15 DIAGNOSIS — F1120 Opioid dependence, uncomplicated: Secondary | ICD-10-CM | POA: Diagnosis present

## 2016-11-15 DIAGNOSIS — Z8249 Family history of ischemic heart disease and other diseases of the circulatory system: Secondary | ICD-10-CM

## 2016-11-15 DIAGNOSIS — B182 Chronic viral hepatitis C: Secondary | ICD-10-CM | POA: Diagnosis present

## 2016-11-15 DIAGNOSIS — F1920 Other psychoactive substance dependence, uncomplicated: Secondary | ICD-10-CM

## 2016-11-15 DIAGNOSIS — Z8349 Family history of other endocrine, nutritional and metabolic diseases: Secondary | ICD-10-CM

## 2016-11-15 DIAGNOSIS — Z8261 Family history of arthritis: Secondary | ICD-10-CM

## 2016-11-15 DIAGNOSIS — T63461S Toxic effect of venom of wasps, accidental (unintentional), sequela: Secondary | ICD-10-CM

## 2016-11-15 HISTORY — DX: Other psychoactive substance use, unspecified, uncomplicated: F19.90

## 2016-11-15 HISTORY — DX: Tobacco use: Z72.0

## 2016-11-15 HISTORY — DX: Unspecified viral hepatitis C without hepatic coma: B19.20

## 2016-11-15 LAB — CBC WITH DIFFERENTIAL/PLATELET
BASOS PCT: 0 %
Basophils Absolute: 0 10*3/uL (ref 0.0–0.1)
EOS ABS: 0.1 10*3/uL (ref 0.0–0.7)
Eosinophils Relative: 2 %
HCT: 38.6 % (ref 36.0–46.0)
HEMOGLOBIN: 13.1 g/dL (ref 12.0–15.0)
Lymphocytes Relative: 35 %
Lymphs Abs: 1.9 10*3/uL (ref 0.7–4.0)
MCH: 29 pg (ref 26.0–34.0)
MCHC: 33.9 g/dL (ref 30.0–36.0)
MCV: 85.4 fL (ref 78.0–100.0)
Monocytes Absolute: 0.5 10*3/uL (ref 0.1–1.0)
Monocytes Relative: 9 %
NEUTROS PCT: 54 %
Neutro Abs: 2.9 10*3/uL (ref 1.7–7.7)
Platelets: 271 10*3/uL (ref 150–400)
RBC: 4.52 MIL/uL (ref 3.87–5.11)
RDW: 12.6 % (ref 11.5–15.5)
WBC: 5.4 10*3/uL (ref 4.0–10.5)

## 2016-11-15 LAB — BASIC METABOLIC PANEL
Anion gap: 9 (ref 5–15)
BUN: 12 mg/dL (ref 6–20)
CHLORIDE: 102 mmol/L (ref 101–111)
CO2: 26 mmol/L (ref 22–32)
CREATININE: 0.95 mg/dL (ref 0.44–1.00)
Calcium: 9.5 mg/dL (ref 8.9–10.3)
GFR calc non Af Amer: >60 mL/min (ref >60)
Glucose, Bld: 127 mg/dL — ABNORMAL HIGH (ref 65–99)
POTASSIUM: 4 mmol/L (ref 3.5–5.1)
SODIUM: 137 mmol/L (ref 135–145)

## 2016-11-15 MED ORDER — CEFPODOXIME PROXETIL 200 MG PO TABS
400.0000 mg | ORAL_TABLET | Freq: Two times a day (BID) | ORAL | Status: DC
  Administered 2016-11-15 – 2016-11-17 (×4): 400 mg via ORAL
  Filled 2016-11-15 (×4): qty 2

## 2016-11-15 MED ORDER — CLINDAMYCIN PHOSPHATE 600 MG/50ML IV SOLN
600.0000 mg | Freq: Once | INTRAVENOUS | Status: AC
  Administered 2016-11-15: 600 mg via INTRAVENOUS
  Filled 2016-11-15: qty 50

## 2016-11-15 MED ORDER — SULFAMETHOXAZOLE-TRIMETHOPRIM 800-160 MG PO TABS
1.0000 | ORAL_TABLET | Freq: Two times a day (BID) | ORAL | Status: DC
  Administered 2016-11-15 – 2016-11-17 (×4): 1 via ORAL
  Filled 2016-11-15 (×4): qty 1

## 2016-11-15 MED ORDER — FLUORESCEIN SODIUM 0.6 MG OP STRP
ORAL_STRIP | OPHTHALMIC | Status: AC
  Filled 2016-11-15: qty 1

## 2016-11-15 MED ORDER — IOPAMIDOL (ISOVUE-300) INJECTION 61%
100.0000 mL | Freq: Once | INTRAVENOUS | Status: AC | PRN
  Administered 2016-11-15: 80 mL via INTRAVENOUS

## 2016-11-15 MED ORDER — KCL IN DEXTROSE-NACL 10-5-0.45 MEQ/L-%-% IV SOLN
INTRAVENOUS | Status: DC
  Administered 2016-11-15: 23:00:00 via INTRAVENOUS
  Filled 2016-11-15 (×3): qty 1000

## 2016-11-15 MED ORDER — TETRACAINE HCL 0.5 % OP SOLN
2.0000 [drp] | Freq: Once | OPHTHALMIC | Status: AC
  Administered 2016-11-15: 2 [drp] via OPHTHALMIC
  Filled 2016-11-15: qty 4

## 2016-11-15 MED ORDER — MORPHINE SULFATE (PF) 4 MG/ML IV SOLN
4.0000 mg | Freq: Once | INTRAVENOUS | Status: AC
  Administered 2016-11-15: 4 mg via INTRAVENOUS
  Filled 2016-11-15: qty 1

## 2016-11-15 MED ORDER — NICOTINE 14 MG/24HR TD PT24
14.0000 mg | MEDICATED_PATCH | Freq: Every day | TRANSDERMAL | Status: DC
  Administered 2016-11-16 – 2016-11-17 (×2): 14 mg via TRANSDERMAL
  Filled 2016-11-15 (×2): qty 1

## 2016-11-15 MED ORDER — INFLUENZA VAC SPLIT QUAD 0.5 ML IM SUSY: 0.5000 mL | PREFILLED_SYRINGE | INTRAMUSCULAR | Status: DC

## 2016-11-15 MED ORDER — FLUORESCEIN SODIUM 1 MG OP STRP
1.0000 | ORAL_STRIP | Freq: Once | OPHTHALMIC | Status: AC
  Administered 2016-11-15: 1 via OPHTHALMIC

## 2016-11-15 MED ORDER — MORPHINE SULFATE (PF) 4 MG/ML IV SOLN
4.0000 mg | INTRAVENOUS | Status: DC | PRN
  Administered 2016-11-15 – 2016-11-16 (×3): 4 mg via INTRAVENOUS
  Filled 2016-11-15 (×3): qty 1

## 2016-11-15 NOTE — ED Notes (Signed)
NPO after midnight. Surgery most likely in AM

## 2016-11-15 NOTE — H&P (Signed)
Ophthalmology H/P NOTE  Alexa Sims,Alexa Sims, 46 y.o. female Date of Service:  11/15/2016  Requesting physician: No att. providers found  Information Obtained from: patient Chief Complaint:  LUL swelling and pain  HPI/Discussion:  Alexa Moulderaula Gullick is a 46 y.o. female with a history of multiple skin lesions and abscesses, Hepatitis C (treated) and IVDU (last use in February 2018) presenting for a one-week history of swelling and erythema of the left eye s/p what she thinks may have been a bee (hornet?) sting. She is unclear whether she was actually bitten. Within 48 hours, patient developed left-sided facial swelling and a "pimple" lateral to the left eyebrow. She has a history of allergy to insect stings, however she did not develop shortness of breath, angioedema, or hives at this time. Within the last couple days she has had significantly increased swelling, erythema, and pain of the superior eyelid. She has been attempting saline washes of her eye and Clearasil. No diplopia. No pain with eye movement.  Past Ocular Hx:  Denies Ocular Meds:  None Family ocular history: Noncontributory  Past Medical History:  Diagnosis Date  . Anxiety   . Depression   . Lumbar herniated disc 2011   L5  . No pertinent past medical history   . Panic attack    Past Surgical History:  Procedure Laterality Date  . CESAREAN SECTION  06/07/2011   Procedure: CESAREAN SECTION;  Surgeon: Loney LaurenceMichelle A Horvath, MD;  Location: [facility];  Service: Gynecology;  Laterality: N/A;  . CHOLECYSTECTOMY    . KNEE SURGERY     partial meniscectomy right knee    Prior to Admission Meds:  See record  Inpatient Meds: [facility]  Allergies  Allergen Reactions  . Tramadol Nausea And Vomiting and Other (See Comments)    Blurred vision   Social History  Substance Use Topics  . Smoking status: Current Every Day Smoker    Packs/day: 0.00    Years: 0.00    Types: Cigarettes  . Smokeless tobacco: Never Used  .  Alcohol use 0.5 oz/week    1 Standard drinks or equivalent per week     Comment: occ   Family History  Problem Relation Age of Onset  . Birth defects Brother   . Hyperlipidemia Father   . Hypertension Father   . Heart attack Father   . Rheum arthritis Sister   . Sudden death Paternal Uncle   . Hypertension Maternal Grandmother   . Diabetes Neg Hx   . Anesthesia problems Neg Hx     ROS: Other than ROS in the HPI, all other systems were negative.  Exam: Temp: 98.6 F (37 C) Pulse Rate: 60 BP: 108/61 Resp: 14 SpO2: 100 %  Visual Acuity:  near   OD 20/30   OS 20/20     OD OS  Confr Vis Fields Full Full  EOM (Primary) Full Full  Lids/Lashes Normal 3+ upper lid edema and erythema, very tense  Conjunctiva White, quiet White, quiet, no chemosis  Adnexa  Normal Upper lid as above (see pics under Media tab)  Pupils  3 --> 2, brisk, no rAPD 3 --> 2, brisk, no rAPD  Cornea  Clear Clear  Anterior Chamber Formed, grossly quiet Formed, grossly queit  Lens:  Clear Clear  IOP 10 13  Fundus - Dilated? YES   Optic Disc 0.4, pink, healthy appearance 0.4, pink, healthy appearance (no edema, no hemorrhage)  Post Seg:  Retina  Vessels Normal caliber Normal caliber                  Vitreous  Clear Clear                  Macula Normal Normal                  Periphery No holes or tears No holes or tears       Neuro:  Oriented to person, place, and time:  Yes Psychiatric:  Mood and Affect Appropriate:  Yes  CT orbits with contrast (12/01/2016) Pronounced inflammatory change of the left upper eye lid and lateral face with swelling and nonenhancing fluid collections measuring up to 2.7 cm in diameter consistent with abscess formation. All this appears to be superficial inflammation, without any evidence of postseptal orbital pathology.  A/P:  46 y.o. female with left preseptal cellulitis s/p presumed insect bite 1 week ago  1) Left preseptal cellulitis - No  orbital signs (e.g. chemosis, proptosis, diplopia, EOM deficit, or optic neuropathy). - Large abscess is noted on CT, which will likely require orbitotomy with packing. - In the interim, will start IV vancomycin and ceftriaxone and admit overnight. - Will post for orbitotomy tomorrow AM at 0830. Can cancel if there is significant improvement with antibiotics alone. - Make NPO after midnight.  R Fabian Sharp, MD  R Fabian Sharp, MD 12-01-16, 6:26 PM

## 2016-11-15 NOTE — H&P (Signed)
History and Physical    Alexa Sims ZOX:[id number] DOB: [birth year] DOA: 11/15/2016  PCP: Caroline More, PA-C  Patient coming from: home   Chief Complaint: left eye pain  HPI: Alexa Sims is a 46 y.o. female with medical history significant for IVDU and chronic hep c presents with  About one week ago sitting on back deck and stung by wasp above left eye. Awoke the next morning and there was a tender nodule there that over this past week has grown in size. Painful. Including when moves eye. No drainage from eye or other site. Has treated at home w/ benedryl to no avail. Last ivdu was in February, denies other substances. Hx hep c, denies hx cirrhosis, no hx variceal bleed, no history ascites. Subjective fevers at home a few nights ago. Occasional nausea. Otherwise feeling well.  ED Course: CT, clindamycin, ophtho consult.  Review of Systems: As per HPI otherwise 10 point review of systems negative.    Past Medical History:  Diagnosis Date  . Anxiety   . Depression   . Lumbar herniated disc 2011   L5  . No pertinent past medical history   . Panic attack     Past Surgical History:  Procedure Laterality Date  . CESAREAN SECTION  06/07/2011   Procedure: CESAREAN SECTION;  Surgeon: Loney Laurence, MD;  Location: [facility];  Service: Gynecology;  Laterality: N/A;  . CHOLECYSTECTOMY    . KNEE SURGERY     partial meniscectomy right knee     reports that she has been smoking Cigarettes.  She has been smoking about 0.00 packs per day for the past 0.00 years. She has never used smokeless tobacco. She reports that she drinks about 0.5 oz of alcohol per week . She reports that she uses drugs, including Cocaine, Heroin, Hydrocodone, Benzodiazepines, and Opium.  Allergies  Allergen Reactions  . Tramadol Nausea And Vomiting and Other (See Comments)    Blurred vision    Family History  Problem Relation Age of Onset  . Birth defects Brother   . Hyperlipidemia Father   .  Hypertension Father   . Heart attack Father   . Rheum arthritis Sister   . Sudden death Paternal Uncle   . Hypertension Maternal Grandmother   . Diabetes Neg Hx   . Anesthesia problems Neg Hx     Prior to Admission medications   Medication Sig Start Date End Date Taking? Authorizing Provider  diphenhydrAMINE (BENADRYL) 25 mg capsule Take 25 mg by mouth every 6 (six) hours as needed for allergies.   Yes [provider]    Physical Exam: Vitals:   11/15/16 1245 11/15/16 1350 11/15/16 1430 11/15/16 1702  BP: 120/85 (!) 128/96 107/73 108/61  Pulse: 60 64 61 60  Resp:  16  14  Temp:    98.6 F (37 C)  TempSrc:    Oral  SpO2: 99% 98% 100% 100%  Weight:      Height:        Constitutional: NAD, calm, comfortable Vitals:   11/15/16 1245 11/15/16 1350 11/15/16 1430 11/15/16 1702  BP: 120/85 (!) 128/96 107/73 108/61  Pulse: 60 64 61 60  Resp:  16  14  Temp:    98.6 F (37 C)  TempSrc:    Oral  SpO2: 99% 98% 100% 100%  Weight:      Height:       Eyes: right eye normal. Left eye upper lid erythematous tender fluctuant nodule causing eye to be shut.  No conjunctival injection. Both pupils dilated (is s/p ophtho exam).  ENMT: Mucous membranes are moist.  Neck: normal, supple, no masses, no thyromegaly Respiratory: clear to auscultation bilaterally, no wheezing, no crackles. Normal respiratory effort. No accessory muscle use.  Cardiovascular: Regular rate and rhythm, no murmurs / rubs / gallops. No extremity edema. 2+ pedal pulses. No carotid bruits.  Abdomen: no tenderness, no masses palpated. No hepatosplenomegaly. Bowel sounds positive.  Musculoskeletal: no clubbing / cyanosis. No joint deformity upper and lower extremities. Good ROM, no contractures. Normal muscle tone.  Skin: eye as above Neurologic: moving all 4 extremities Psychiatric: Normal judgment and insight.     Labs on Admission: I have personally reviewed following labs and imaging  studies  CBC:  Recent Labs Lab 11/15/16 1156  WBC 5.4  NEUTROABS 2.9  HGB 13.1  HCT 38.6  MCV 85.4  PLT 271   Basic Metabolic Panel:  Recent Labs Lab 11/15/16 1156  NA 137  K 4.0  CL 102  CO2 26  GLUCOSE 127*  BUN 12  CREATININE 0.95  CALCIUM 9.5   GFR: Estimated Creatinine Clearance: 62.5 mL/min (by C-G formula based on SCr of 0.95 mg/dL). Liver Function Tests: No results for input(s): AST, ALT, ALKPHOS, BILITOT, PROT, ALBUMIN in the last 168 hours. No results for input(s): LIPASE, AMYLASE in the last 168 hours. No results for input(s): AMMONIA in the last 168 hours. Coagulation Profile: No results for input(s): INR, PROTIME in the last 168 hours. Cardiac Enzymes: No results for input(s): CKTOTAL, CKMB, CKMBINDEX, TROPONINI in the last 168 hours. BNP (last 3 results) No results for input(s): PROBNP in the last 8760 hours. HbA1C: No results for input(s): HGBA1C in the last 72 hours. CBG: No results for input(s): GLUCAP in the last 168 hours. Lipid Profile: No results for input(s): CHOL, HDL, LDLCALC, TRIG, CHOLHDL, LDLDIRECT in the last 72 hours. Thyroid Function Tests: No results for input(s): TSH, T4TOTAL, FREET4, T3FREE, THYROIDAB in the last 72 hours. Anemia Panel: No results for input(s): VITAMINB12, FOLATE, FERRITIN, TIBC, IRON, RETICCTPCT in the last 72 hours. Urine analysis:    Component Value Date/Time   COLORURINE YELLOW 08/02/2012 1431   APPEARANCEUR CLOUDY (A) 08/02/2012 1431   LABSPEC 1.012 08/02/2012 1431   PHURINE 6.5 08/02/2012 1431   GLUCOSEU NEGATIVE 08/02/2012 1431   HGBUR LARGE (A) 08/02/2012 1431   BILIRUBINUR NEGATIVE 08/02/2012 1431   KETONESUR NEGATIVE 08/02/2012 1431   PROTEINUR NEGATIVE 08/02/2012 1431   UROBILINOGEN 0.2 08/02/2012 1431   NITRITE NEGATIVE 08/02/2012 1431   LEUKOCYTESUR MODERATE (A) 08/02/2012 1431    Radiological Exams on Admission: Ct Orbits W Contrast  Result Date: 11/15/2016 CLINICAL DATA:  Left  periorbital swelling and redness over the last week. Headache. EXAM: CT ORBITS WITH CONTRAST TECHNIQUE: Multidetector CT images was performed according to the standard protocol following intravenous contrast administration. CONTRAST:  80mL ISOVUE-300 IOPAMIDOL (ISOVUE-300) INJECTION 61% COMPARISON:  07/28/2012 FINDINGS: Orbits: Pronounced swelling of the eye lid region and lateral face on the left with nonenhancing fluid collections, the largest measuring up to 2.7 cm in diameter, consistent with superficial abscess formation. No evidence of postseptal extension. Both globes appear normal. Optic nerves are normal. Extra-ocular muscles are normal. Intraorbital fat is normal. Orbital vascular structures appear intact. No evidence of cavernous sinus thrombosis. Visualized sinuses: Clear Soft tissues: Otherwise negative Limited intracranial: Normal IMPRESSION: Pronounced inflammatory change of the left upper eye lid and lateral face with swelling and nonenhancing fluid collections measuring up to 2.7 cm in  diameter consistent with abscess formation. All this appears to be superficial inflammation, without any evidence of postseptal orbital pathology. Electronically Signed   By: Paulina Fusi M.D.   On: 11/15/2016 13:20     Assessment/Plan Active Problems:   Preseptal cellulitis of left upper eyelid  # preseptal cellulitis - exam and imaging not consistent w/ orbital cellulitis. Ophtho has seen, plan for I and d tomorrow, perhaps w/ general anesthesia. - npo after midnight, fluids ordered to start then - abx: bactrim ds 1 tab po bid and cefpodoxime 400 mg po bid  # Hep C, chronic - denies hx cirrhosis, exam today unremarkable - f/u lfts, pt, ptt  # Tobacco abuse - nicotine patch    DVT prophylaxis: scds, pharmacologic ppx after surgery Code Status: full Family Communication: husband Richardo Priest [phone] Disposition Plan: home Consults called: ophtho ([place]) Admission status: obs  Silvano Bilis MD [facility] Pager [phone]  If 7PM-7AM, please contact night-coverage [email or web address] Password [email or web address]  11/15/2016, 7:27 PM

## 2016-11-15 NOTE — ED Notes (Signed)
Dr. Silverio LayYao advised pt needs to be seen first in the ED. He advised pt may be able to have the procedure done in the ED then discharged home and may not need to be admitted to the floor.

## 2016-11-15 NOTE — ED Notes (Signed)
Patient was able to order dinner tray.  ED Tech went down to get it.  Patient eating dinner at this time.  No other complaints.

## 2016-11-15 NOTE — ED Triage Notes (Signed)
Swelling to her left eye x 3 days.

## 2016-11-15 NOTE — ED Notes (Signed)
Randel friend [phone]

## 2016-11-15 NOTE — ED Provider Notes (Signed)
[facility] EMERGENCY DEPARTMENT Provider Note   CSN: [id number] Arrival date & time: 11/15/16  1045     History   Chief Complaint Chief Complaint  Patient presents with  . Cellulitis    HPI Alexa Sims is a 46 y.o. female.  HPI   Patient is a 46 year old female with a history of multiple skin lesions and abscesses, Hepatitis C (treated) and IVDU (last use in February 2018) presenting for a one-week history of swelling and erythema of the left eye.  Patient reports that one week ago she was out on her deck at night, and felt what she believes is a hornet brush her left cheek.  Patient does not believe that this insect bite brushed her left eye.  Patient is unsure whether she was actually bitten.  Within 48 hours, patient developed left-sided facial swelling and a "pimple" with lateral to the left eyebrow.  Patient reports that she has a history of allergy to insect stings, however she did not develop shortness of breath, angioedema, or hives at this time.  Patient reports that within the last couple days she has had significantly increased swelling, erythema, and pain of the left eye all in the superior eyelid.  Patient has been attempting saline washes of her eye, and Clearasil.  No purulent drainage from the area of erythema. Patient reports she woke up with some crusting of the left eye this morning.  Patient denies any recorded fevers at home, but reports she has had night sweats.  Patient reports couple days ago she was nauseous and vomited once.  Patient reports decreased appetite.  Patient also reports that the decrease in her visual acuity due to the swelling around her left eye has made her dizzy. Patient denies any immunosuppressing conditions or medications.   Per patient, patient's tetanus shot is up-to-date.  Last tetanus within 1 year.  Past Medical History:  Diagnosis Date  . Anxiety   . Depression   . Lumbar herniated disc 2011   L5  . No pertinent past  medical history   . Panic attack     Patient Active Problem List   Diagnosis Date Noted  . Major depressive disorder, recurrent episode, moderate (HCC) 07/31/2012    Class: Acute  . Polysubstance dependence (HCC) 07/31/2012  . Polysubstance dependence including opioid type drug, continuous use (HCC) 07/31/2012  . Right ankle injury 09/11/2010  . Right knee injury 09/11/2010    Past Surgical History:  Procedure Laterality Date  . CESAREAN SECTION  06/07/2011   Procedure: CESAREAN SECTION;  Surgeon: Loney Laurence, MD;  Location: [facility];  Service: Gynecology;  Laterality: N/A;  . CHOLECYSTECTOMY    . KNEE SURGERY     partial meniscectomy right knee    OB History    Gravida Para Term Preterm AB Living   3 3 3  0 0 3   SAB TAB Ectopic Multiple Live Births   0 0 0 0 1       Home Medications    Prior to Admission medications   Medication Sig Start Date End Date Taking? Authorizing Provider  lamoTRIgine (LAMICTAL) 25 MG tablet Take 2 tablets (50 mg total) by mouth daily. For mood stabilization 08/04/12   Sanjuana Kava, NP    Family History Family History  Problem Relation Age of Onset  . Birth defects Brother   . Hyperlipidemia Father   . Hypertension Father   . Heart attack Father   . Rheum arthritis Sister   .  Sudden death Paternal Uncle   . Hypertension Maternal Grandmother   . Diabetes Neg Hx   . Anesthesia problems Neg Hx     Social History Social History  Substance Use Topics  . Smoking status: Current Every Day Smoker    Packs/day: 0.00    Years: 0.00    Types: Cigarettes  . Smokeless tobacco: Never Used  . Alcohol use 0.5 oz/week    1 Standard drinks or equivalent per week     Comment: occ     Allergies   Aspirin and Tramadol   Review of Systems Review of Systems  Constitutional: Positive for appetite change and chills. Negative for fever.  HENT: Positive for facial swelling. Negative for congestion, rhinorrhea, sinus pain and sore throat.     Eyes: Positive for photophobia, pain, discharge, redness and visual disturbance.  Respiratory: Negative for cough, chest tightness and shortness of breath.   Cardiovascular: Negative for chest pain and leg swelling.  Gastrointestinal: Positive for nausea. Negative for abdominal pain, diarrhea and vomiting.  Genitourinary: Negative for dysuria and flank pain.  Musculoskeletal: Positive for arthralgias. Negative for back pain and myalgias.  Skin: Negative for rash.  Neurological: Positive for dizziness and headaches. Negative for syncope and light-headedness.     Physical Exam Updated Vital Signs BP (!) 127/92   Pulse 74   Temp 98.4 F (36.9 C) (Oral)   Resp 20   Ht 5\' 3"  (1.6 m)   Wt 59 kg (130 lb)   LMP 10/12/2016   SpO2 100%   BMI 23.03 kg/m   Physical Exam  Constitutional: She appears well-developed and well-nourished.  Patient appears uncomfortable.   HENT:  Head: Normocephalic and atraumatic.  Mouth/Throat: Oropharynx is clear and moist. No oropharyngeal exudate.  Eyes: Right eye exhibits no discharge. Left eye exhibits no discharge.  Unable to evert left eyelid due to swelling.  Extraocular movements appear constrained to the left eye with lateral movements to the left. Pupils equal and round. Left pupil minimally reactive to light.  No conjunctival injection of the left eye.  See image.  Fluorescein exam demonstrates no focal uptakes or ulceration of the left cornea.  Neck: Normal range of motion. Neck supple.  Left preauricular lymphadenopathy.  Cardiovascular: Normal rate, regular rhythm, S1 normal and S2 normal.   No murmur heard. Pulmonary/Chest: Effort normal and breath sounds normal. She has no wheezes. She has no rales.  Abdominal: Soft. She exhibits no distension. There is no tenderness. There is no guarding.  Musculoskeletal: Normal range of motion. She exhibits no edema or deformity.  Neurological: She is alert.  Possible EOM entrapment laterally of left  eye. All other cranial nerves grossly intact. Patient moves extremities with good coordination and without difficulty.  Skin: Skin is warm and dry. No rash noted. No erythema.  Psychiatric: She has a normal mood and affect. Her behavior is normal. Judgment and thought content normal.  Nursing note and vitals reviewed.        ED Treatments / Results  Labs (all labs ordered are listed, but only abnormal results are displayed) Labs Reviewed - No data to display  EKG  EKG Interpretation None       Radiology No results found.  Procedures Procedures (including critical care time)  Medications Ordered in ED Medications - No data to display   Initial Impression / Assessment and Plan / ED Course  I have reviewed the triage vital signs and the nursing notes.  Pertinent labs & imaging results  that were available during my care of the patient were reviewed by me and considered in my medical decision making (see chart for details).     Final Clinical Impressions(s) / ED Diagnoses   Final diagnoses:  None   Differential diagnosis includes cellulitis, abscess of left eye, erysipelas, periorbital cellulitis, orbital cellulitis. Patient case discussed with Dr. Chaney Mallingavid Yao.  We will proceed with CT orbits.  We will proceed with basic lab work, and morphine for pain control.  BMP and CBC with differential without abnormality.  Vital signs stable. Patient afebrile, nontachycardic, and normotensive. CT of the orbits demonstrates 2.7 cm fluid collection in the superior left eyelid.  Appears to be no evidence on CT of post septal cellulitis.  Inflammatory changes per CT report appear to be superficial.  Patient received 600 mg IV clindamycin in emergency department today.  Per conversation with Dr. Dione BoozeGroat  from ophthalmology regarding patient, [facility] in the event of preseptal cellulitis. Dr. Dione BoozeGroat is accepting and Dr. Cathren LaineKevin Steinl in ED aware of  arrival.  This is a shared visit with Dr. Chaney Mallingavid Yao. Patient was independently evaluated by this attending physician. Attending physician consulted in evaluation and transfer management.   New Prescriptions New Prescriptions   No medications on file        Delia ChimesMurray, Toran Murch B, PA-C 11/15/16 1852    Charlynne PanderYao, David Hsienta, MD 11/16/16 [phone]

## 2016-11-16 ENCOUNTER — Observation Stay (HOSPITAL_COMMUNITY): Payer: Self-pay | Admitting: Anesthesiology

## 2016-11-16 ENCOUNTER — Encounter (HOSPITAL_COMMUNITY)
Admission: EM | Disposition: A | Discharge status: Home / Self Care | Payer: Self-pay | Source: Home / Self Care | Attending: Family Medicine

## 2016-11-16 ENCOUNTER — Observation Stay (HOSPITAL_COMMUNITY): Payer: Self-pay

## 2016-11-16 DIAGNOSIS — L0201 Cutaneous abscess of face: Secondary | ICD-10-CM

## 2016-11-16 DIAGNOSIS — F1120 Opioid dependence, uncomplicated: Secondary | ICD-10-CM

## 2016-11-16 DIAGNOSIS — F1920 Other psychoactive substance dependence, uncomplicated: Secondary | ICD-10-CM

## 2016-11-16 HISTORY — PX: RUPTURED GLOBE EXPLORATION AND REPAIR: SHX2366

## 2016-11-16 LAB — TYPE AND SCREEN
ABO/RH(D): A POS
ANTIBODY SCREEN: NEGATIVE

## 2016-11-16 LAB — HIV ANTIBODY (ROUTINE TESTING W REFLEX): HIV Screen 4th Generation wRfx: NONREACTIVE

## 2016-11-16 LAB — HEPATIC FUNCTION PANEL
ALBUMIN: 3.2 g/dL — AB (ref 3.5–5.0)
ALK PHOS: 55 U/L (ref 38–126)
ALT: 25 U/L (ref 14–54)
AST: 23 U/L (ref 15–41)
BILIRUBIN TOTAL: 0.5 mg/dL (ref 0.3–1.2)
Bilirubin, Direct: 0.1 mg/dL (ref 0.1–0.5)
Indirect Bilirubin: 0.4 mg/dL (ref 0.3–0.9)
TOTAL PROTEIN: 6.6 g/dL (ref 6.5–8.1)

## 2016-11-16 LAB — APTT: aPTT: 31 seconds (ref 24–36)

## 2016-11-16 LAB — SURGICAL PCR SCREEN
MRSA, PCR: POSITIVE — AB
STAPHYLOCOCCUS AUREUS: POSITIVE — AB

## 2016-11-16 LAB — PROTIME-INR
INR: 1.04
PROTHROMBIN TIME: 13.5 s (ref 11.4–15.2)

## 2016-11-16 LAB — ABO/RH: ABO/RH(D): A POS

## 2016-11-16 SURGERY — REPAIR, RUPTURE, GLOBE
Anesthesia: General | Site: Eye | Laterality: Left

## 2016-11-16 MED ORDER — FENTANYL CITRATE (PF) 100 MCG/2ML IJ SOLN
INTRAMUSCULAR | Status: DC | PRN
  Administered 2016-11-16: 100 ug via INTRAVENOUS
  Administered 2016-11-16: 50 ug via INTRAVENOUS
  Administered 2016-11-16: 100 ug via INTRAVENOUS
  Administered 2016-11-16 (×2): 50 ug via INTRAVENOUS
  Administered 2016-11-16: 100 ug via INTRAVENOUS

## 2016-11-16 MED ORDER — LIDOCAINE HCL (CARDIAC) 20 MG/ML IV SOLN
INTRAVENOUS | Status: DC | PRN
  Administered 2016-11-16: 100 mg via INTRAVENOUS

## 2016-11-16 MED ORDER — CHLORHEXIDINE GLUCONATE CLOTH 2 % EX PADS
6.0000 | MEDICATED_PAD | Freq: Every day | CUTANEOUS | Status: DC
  Administered 2016-11-16 – 2016-11-17 (×2): 6 via TOPICAL

## 2016-11-16 MED ORDER — PROMETHAZINE HCL 25 MG/ML IJ SOLN: 6.2500 mg | INTRAMUSCULAR | Status: DC | PRN

## 2016-11-16 MED ORDER — MIDAZOLAM HCL 2 MG/2ML IJ SOLN
INTRAMUSCULAR | Status: AC
  Filled 2016-11-16: qty 2

## 2016-11-16 MED ORDER — IBUPROFEN 600 MG PO TABS
600.0000 mg | ORAL_TABLET | Freq: Four times a day (QID) | ORAL | Status: DC | PRN
  Administered 2016-11-16 – 2016-11-17 (×2): 600 mg via ORAL
  Filled 2016-11-16 (×2): qty 1

## 2016-11-16 MED ORDER — LIDOCAINE 2% (20 MG/ML) 5 ML SYRINGE
INTRAMUSCULAR | Status: AC
  Filled 2016-11-16: qty 5

## 2016-11-16 MED ORDER — SODIUM CHLORIDE 0.9% FLUSH: 10.0000 mL | Freq: Two times a day (BID) | INTRAVENOUS | Status: DC

## 2016-11-16 MED ORDER — SODIUM CHLORIDE 0.9 % IV SOLN
INTRAVENOUS | Status: DC | PRN
  Administered 2016-11-16: 500 mL

## 2016-11-16 MED ORDER — MUPIROCIN 2 % EX OINT
1.0000 "application " | TOPICAL_OINTMENT | Freq: Two times a day (BID) | CUTANEOUS | Status: DC
  Administered 2016-11-16 – 2016-11-17 (×2): 1 via NASAL
  Filled 2016-11-16: qty 22

## 2016-11-16 MED ORDER — ONDANSETRON HCL 4 MG/2ML IJ SOLN
INTRAMUSCULAR | Status: DC | PRN
  Administered 2016-11-16: 4 mg via INTRAVENOUS

## 2016-11-16 MED ORDER — LACTATED RINGERS IV SOLN
INTRAVENOUS | Status: DC
Start: 2016-11-16 — End: 2016-11-16
  Administered 2016-11-16: 10:00:00 via INTRAVENOUS

## 2016-11-16 MED ORDER — HYDROMORPHONE HCL 1 MG/ML IJ SOLN
INTRAMUSCULAR | Status: AC
  Administered 2016-11-16: 0.5 mg via INTRAVENOUS
  Filled 2016-11-16: qty 1

## 2016-11-16 MED ORDER — FENTANYL CITRATE (PF) 250 MCG/5ML IJ SOLN
INTRAMUSCULAR | Status: AC
  Filled 2016-11-16: qty 5

## 2016-11-16 MED ORDER — SODIUM CHLORIDE 0.9% FLUSH: 10.0000 mL | INTRAVENOUS | Status: DC | PRN

## 2016-11-16 MED ORDER — HYDROMORPHONE HCL 1 MG/ML IJ SOLN
0.2500 mg | INTRAMUSCULAR | Status: DC | PRN
  Administered 2016-11-16 (×2): 0.5 mg via INTRAVENOUS

## 2016-11-16 MED ORDER — PROPOFOL 10 MG/ML IV BOLUS
INTRAVENOUS | Status: DC | PRN
  Administered 2016-11-16: 200 mg via INTRAVENOUS

## 2016-11-16 MED ORDER — STERILE WATER FOR IRRIGATION IR SOLN
Status: DC | PRN
  Administered 2016-11-16: 1000 mL

## 2016-11-16 MED ORDER — HYDROCODONE-ACETAMINOPHEN 5-325 MG PO TABS
2.0000 | ORAL_TABLET | Freq: Once | ORAL | Status: AC
  Administered 2016-11-16: 2 via ORAL
  Filled 2016-11-16: qty 2

## 2016-11-16 MED ORDER — DIPHENHYDRAMINE HCL 50 MG/ML IJ SOLN
INTRAMUSCULAR | Status: DC | PRN
  Administered 2016-11-16: 12.5 mg via INTRAVENOUS

## 2016-11-16 MED ORDER — MIDAZOLAM HCL 5 MG/5ML IJ SOLN
INTRAMUSCULAR | Status: DC | PRN
  Administered 2016-11-16: 2 mg via INTRAVENOUS

## 2016-11-16 SURGICAL SUPPLY — 28 items
APL SRG 3 HI ABS STRL LF PLS (MISCELLANEOUS)
APPLICATOR DR MATTHEWS STRL (MISCELLANEOUS) IMPLANT
BAG ISL DRAPE 18X18 STRL (DRAPES)
BAG ISOLATION DRAPE 18X18 (DRAPES) IMPLANT
BLADE EYE CATARACT 19 1.4 BEAV (BLADE) IMPLANT
BLADE MVR KNIFE 20G (BLADE) IMPLANT
CANNULA ANTERIOR CHAMBER 27GA (MISCELLANEOUS) ×3 IMPLANT
CAUTERY EYE LOW TEMP 1300F FIN (OPHTHALMIC RELATED) IMPLANT
CORDS BIPOLAR (ELECTRODE) ×3 IMPLANT
COVER SURGICAL LIGHT HANDLE (MISCELLANEOUS) ×3 IMPLANT
DRAPE ISOLATION BAG 18X18 (DRAPES)
DRAPE OPHTHALMIC 40X48 W POUCH (DRAPES) ×3 IMPLANT
ERASER HMR WETFIELD 23G BP (MISCELLANEOUS) IMPLANT
GAUZE PACKING IODOFORM 1/4X15 (GAUZE/BANDAGES/DRESSINGS) ×2 IMPLANT
GLOVE BIO SURGEON STRL SZ7.5 (GLOVE) ×3 IMPLANT
GOWN STRL REUS W/ TWL LRG LVL3 (GOWN DISPOSABLE) ×1 IMPLANT
GOWN STRL REUS W/TWL LRG LVL3 (GOWN DISPOSABLE) ×3
KIT BASIN OR (CUSTOM PROCEDURE TRAY) ×3 IMPLANT
LENS BIOM SUPER VIEW SET DISP (OPHTHALMIC RELATED) IMPLANT
NS IRRIG 1000ML POUR BTL (IV SOLUTION) ×3 IMPLANT
PACK CATARACT CUSTOM (CUSTOM PROCEDURE TRAY) ×3 IMPLANT
PAD ARMBOARD 7.5X6 YLW CONV (MISCELLANEOUS) ×6 IMPLANT
ROLLS DENTAL (MISCELLANEOUS) IMPLANT
SPEAR EYE SURG WECK-CEL (MISCELLANEOUS) IMPLANT
SPECIMEN JAR SMALL (MISCELLANEOUS) IMPLANT
SUT ETHILON 10 0 CS140 6 (SUTURE) ×6 IMPLANT
TOWEL OR 17X24 6PK STRL BLUE (TOWEL DISPOSABLE) ×6 IMPLANT
WATER STERILE IRR 1000ML POUR (IV SOLUTION) ×3 IMPLANT

## 2016-11-16 NOTE — Brief Op Note (Signed)
11/15/2016 - 11/16/2016  11:38 AM  PATIENT:  Alexa Sims  46 y.o. female  PRE-OPERATIVE DIAGNOSIS:  left orbital abscess  POST-OPERATIVE DIAGNOSIS:  left orbital abscess  PROCEDURE:  Procedure(s): INCISION AND DRAINAGE OF LEFT ORBITAL ABSCESS LEFT (Left)  SURGEON:  Surgeon(s) and Role:    * Groat, Bertram Millardichard Scott, MD - Primary    * Carmela RimaPatel, Analiyah Lechuga, MD - Assisting  PHYSICIAN ASSISTANT:   ASSISTANTS: none   ANESTHESIA:   none  EBL:  0 mL   BLOOD ADMINISTERED:none  DRAINS: iodoform gauze   LOCAL MEDICATIONS USED:  NONE  SPECIMEN:  Aspirate  DISPOSITION OF SPECIMEN:  PATHOLOGY  COUNTS:  YES  TOURNIQUET:  * No tourniquets in log *  DICTATION: .Note written in EPIC  PLAN OF CARE: Admit for overnight observation  PATIENT DISPOSITION:  PACU - hemodynamically stable.   Delay start of Pharmacological VTE agent (>24hrs) due to surgical blood loss or risk of bleeding: not applicable

## 2016-11-16 NOTE — Anesthesia Procedure Notes (Signed)
Central Venous Catheter Insertion Performed by: Heather RobertsSINGER, Eloise Mula, anesthesiologist Start/End10/20/2018 10:22 AM, 11/16/2016 10:33 AM Patient location: Pre-op. Preanesthetic checklist: patient identified, IV checked, site marked, risks and benefits discussed, surgical consent, monitors and equipment checked, pre-op evaluation, timeout performed and anesthesia consent Position: Trendelenburg Lidocaine 1% used for infiltration and patient sedated Hand hygiene performed , maximum sterile barriers used  and Seldinger technique used Catheter size: 8 Fr Total catheter length 16. Central line was placed.Double lumen Procedure performed using ultrasound guided technique. Ultrasound Notes:anatomy identified, needle tip was noted to be adjacent to the nerve/plexus identified, no ultrasound evidence of intravascular and/or intraneural injection and image(s) printed for medical record Attempts: 1 Following insertion, dressing applied, line sutured and Biopatch. Post procedure assessment: blood return through all ports, free fluid flow and no air  Patient tolerated the procedure well with no immediate complications.

## 2016-11-16 NOTE — Progress Notes (Signed)
CXR in progress  

## 2016-11-16 NOTE — Progress Notes (Signed)
PROGRESS NOTE    Alexa Sims  NGE:[id number] DOB: 30-Dec-1970 DOA: 11/15/2016 PCP: Caroline More, PA-C   Brief Narrative: Alexa Sims is a 46 y.o. female with a history of IV drug abuse and chronic hepatitis C.   Assessment & Plan:   Active Problems:   Polysubstance dependence including opioid type drug, continuous use (HCC)   Preseptal cellulitis of left upper eyelid   Preseptal cellulitis Abscess Patient is s/p I&D by opthalmology this morning. -wound cultures pending -continue Bactrim/cefpodoxime, awaiting culture results  Hepatitis C Transaminases wnl. -HIV pending -outpatient follow-up  Tobacco abuse -Continue nicotine patch  History of IV drug abuse   DVT prophylaxis: SCDs Code Status: Full code Family Communication: Friend at bedside Disposition Plan: Discharge likely in 24 hours   Consultants:   Opthalmology  Procedures:   I&D of abscess  Antimicrobials:  Bactrim  Cefpodoxime    Subjective: Feels somewhat better after I&D  Objective: Vitals:   11/16/16 1148 11/16/16 1200 11/16/16 1203 11/16/16 1218  BP: 102/62  (!) 106/58 101/61  Pulse: (!) 56 (!) 57 (!) 53 (!) 56  Resp: 11 16 11 12   Temp:      TempSrc:      SpO2: 98% 98% 99% 98%  Weight:      Height:        Intake/Output Summary (Last 24 hours) at 11/16/16 1516 Last data filed at 11/16/16 1221  Gross per 24 hour  Intake          4588.67 ml  Output                0 ml  Net          4588.67 ml   Filed Weights   11/15/16 1058 11/15/16 2218  Weight: 59 kg (130 lb) 64.7 kg (142 lb 11.2 oz)    Examination:  General exam: Appears calm and comfortable  HEENT: left eye is swollen shut with surrounding erythema and dressing over left brow Respiratory system: Clear to auscultation. Respiratory effort normal. Cardiovascular system: S1 & S2 heard, RRR. No murmurs. Gastrointestinal system: Abdomen is nondistended, soft and nontender. Normal bowel sounds heard. Central nervous  system: Alert and oriented. No focal neurological deficits. Extremities: No edema. No calf tenderness Skin: No cyanosis. No rashes Psychiatry: Judgement and insight appear normal. Mood & affect appropriate.     Data Reviewed: I have personally reviewed following labs and imaging studies  CBC:  Recent Labs Lab 11/15/16 1156  WBC 5.4  NEUTROABS 2.9  HGB 13.1  HCT 38.6  MCV 85.4  PLT 271   Basic Metabolic Panel:  Recent Labs Lab 11/15/16 1156  NA 137  K 4.0  CL 102  CO2 26  GLUCOSE 127*  BUN 12  CREATININE 0.95  CALCIUM 9.5   GFR: Estimated Creatinine Clearance: 68.4 mL/min (by C-G formula based on SCr of 0.95 mg/dL). Liver Function Tests:  Recent Labs Lab 11/16/16 0014  AST 23  ALT 25  ALKPHOS 55  BILITOT 0.5  PROT 6.6  ALBUMIN 3.2*   No results for input(s): LIPASE, AMYLASE in the last 168 hours. No results for input(s): AMMONIA in the last 168 hours. Coagulation Profile:  Recent Labs Lab 11/16/16 0014  INR 1.04   Cardiac Enzymes: No results for input(s): CKTOTAL, CKMB, CKMBINDEX, TROPONINI in the last 168 hours. BNP (last 3 results) No results for input(s): PROBNP in the last 8760 hours. HbA1C: No results for input(s): HGBA1C in the last 72 hours. CBG: No results for  input(s): GLUCAP in the last 168 hours. Lipid Profile: No results for input(s): CHOL, HDL, LDLCALC, TRIG, CHOLHDL, LDLDIRECT in the last 72 hours. Thyroid Function Tests: No results for input(s): TSH, T4TOTAL, FREET4, T3FREE, THYROIDAB in the last 72 hours. Anemia Panel: No results for input(s): VITAMINB12, FOLATE, FERRITIN, TIBC, IRON, RETICCTPCT in the last 72 hours. Sepsis Labs: No results for input(s): PROCALCITON, LATICACIDVEN in the last 168 hours.  Recent Results (from the past 240 hour(s))  Surgical PCR screen     Status: Abnormal   Collection Time: 11/15/16 11:23 PM  Result Value Ref Range Status   MRSA, PCR POSITIVE (A) NEGATIVE Final    Comment: RCRV G.LOFTON RN  11/16/16 0539 L.CHAMPION    Staphylococcus aureus POSITIVE (A) NEGATIVE Final    Comment: (NOTE) The Xpert SA Assay (FDA approved for NASAL specimens in patients 44 years of age and older), is one component of a comprehensive surveillance program. It is not intended to diagnose infection nor to guide or monitor treatment.          Radiology Studies: Dg Chest Port 1 View  Result Date: 11/16/2016 CLINICAL DATA:  Central venous catheter placement. EXAM: PORTABLE CHEST 1 VIEW COMPARISON:  01/03/2012 FINDINGS: A right IJ central venous catheter is noted with tip overlying the mid SVC. There is no evidence of focal airspace disease, pulmonary edema, suspicious pulmonary nodule/mass, pleural effusion, or pneumothorax. The cardiomediastinal silhouette is unremarkable. IMPRESSION: Right IJ central venous catheter with tip overlying the mid SVC. No evidence of pneumothorax. Electronically Signed   By: Harmon PierJeffrey  Hu M.D.   On: 11/16/2016 12:13   Ct Orbits W Contrast  Result Date: 11/15/2016 CLINICAL DATA:  Left periorbital swelling and redness over the last week. Headache. EXAM: CT ORBITS WITH CONTRAST TECHNIQUE: Multidetector CT images was performed according to the standard protocol following intravenous contrast administration. CONTRAST:  80mL ISOVUE-300 IOPAMIDOL (ISOVUE-300) INJECTION 61% COMPARISON:  07/28/2012 FINDINGS: Orbits: Pronounced swelling of the eye lid region and lateral face on the left with nonenhancing fluid collections, the largest measuring up to 2.7 cm in diameter, consistent with superficial abscess formation. No evidence of postseptal extension. Both globes appear normal. Optic nerves are normal. Extra-ocular muscles are normal. Intraorbital fat is normal. Orbital vascular structures appear intact. No evidence of cavernous sinus thrombosis. Visualized sinuses: Clear Soft tissues: Otherwise negative Limited intracranial: Normal IMPRESSION: Pronounced inflammatory change of the left  upper eye lid and lateral face with swelling and nonenhancing fluid collections measuring up to 2.7 cm in diameter consistent with abscess formation. All this appears to be superficial inflammation, without any evidence of postseptal orbital pathology. Electronically Signed   By: Paulina FusiMark  Shogry M.D.   On: 11/15/2016 13:20        Scheduled Meds: . cefpodoxime  400 mg Oral Q12H  . Chlorhexidine Gluconate Cloth  6 each Topical Q0600  . Influenza vac split quadrivalent PF  0.5 mL Intramuscular Tomorrow-1000  . mupirocin ointment  1 application Nasal BID  . nicotine  14 mg Transdermal Daily  . sulfamethoxazole-trimethoprim  1 tablet Oral Q12H   Continuous Infusions: . dextrose 5 % and 0.45 % NaCl with KCl 10 mEq/L 100 mL/hr at 11/16/16 1308  . lactated ringers Stopped (11/16/16 1307)     LOS: 0 days     Jacquelin Hawkingalph Quincee Gittens, MD [facility] 11/16/2016, 3:16 PM Pager: [phone]  If 7PM-7AM, please contact night-coverage [email or web address] Password [email or web address] 11/16/2016, 3:16 PM

## 2016-11-16 NOTE — Anesthesia Procedure Notes (Signed)
Procedure Name: LMA Insertion Date/Time: 11/16/2016 10:50 AM Performed by: Coralee RudFLORES, Brayah Urquilla Pre-anesthesia Checklist: Patient identified, Emergency Drugs available, Suction available and Patient being monitored Patient Re-evaluated:Patient Re-evaluated prior to induction Oxygen Delivery Method: Circle system utilized Preoxygenation: Pre-oxygenation with 100% oxygen Induction Type: IV induction Ventilation: Mask ventilation without difficulty LMA: LMA inserted LMA Size: 4.0 Number of attempts: 1 Placement Confirmation: positive ETCO2 Tube secured with: Tape Dental Injury: Teeth and Oropharynx as per pre-operative assessment

## 2016-11-16 NOTE — Anesthesia Postprocedure Evaluation (Signed)
Anesthesia Post Note  Patient: Alexa Sims  Procedure(s) Performed: INCISION AND DRAINAGE OF LEFT ORBITAL ABSCESS LEFT (Left Eye)     Patient location during evaluation: PACU Anesthesia Type: General Level of consciousness: sedated Pain management: pain level controlled Vital Signs Assessment: post-procedure vital signs reviewed and stable Respiratory status: spontaneous breathing and respiratory function stable Cardiovascular status: stable Postop Assessment: no apparent nausea or vomiting Anesthetic complications: no    Last Vitals:  Vitals:   11/16/16 1203 11/16/16 1218  BP: (!) 106/58 101/61  Pulse: (!) 53 (!) 56  Resp: 11 12  Temp:    SpO2: 99% 98%    Last Pain:  Vitals:   11/16/16 1208  TempSrc:   PainSc: 6                  Theodoros Stjames DANIEL

## 2016-11-16 NOTE — Op Note (Signed)
Alexa Sims 11/16/2016 Diagnosis: orbital abcess left upper eyelid area  Procedure: Incision and drain of orbital abscess and packing with iodoform gauze Operative Eye:  left eye  Surgeon: Alexa HartNarendra Mafabhai [name], Alexa Sims Estimated Blood Loss: minimal Specimens for Pathology:  None Complications: none   The  patient was prepped and draped in the usual fashion for ocular surgery on the  left eye .  A lid speculum was placed. A # 15 blade was used to make a 1.5 cm incision in the approximate eyelid crease and the abscess drained purulent material.  Samples were taken for gram stain and culture.  The abscess cavity was explored with a curette, further drained, irrigated with bacitracin solution and then packed with iodoform gauze.  The wick was fastened to the brow with a steri strip.  The wound was dressed with a sterile patch.     The patient was transferred with stable vital signs to the post operative recovery area.  The patient tolerated the procedure well and no complications were noted.  Alexa DonathNarendra Mafabhai Alexa Thielke MD

## 2016-11-16 NOTE — Anesthesia Preprocedure Evaluation (Addendum)
Anesthesia Evaluation  Patient identified by MRN, date of birth, ID band Patient awake    Reviewed: Allergy & Precautions, NPO status , Patient's Chart, lab work & pertinent test results  History of Anesthesia Complications Negative for: history of anesthetic complications  Airway Mallampati: II  TM Distance: >3 FB Neck ROM: Full    Dental  (+) Poor Dentition, Dental Advisory Given   Pulmonary Current Smoker,    Pulmonary exam normal        Cardiovascular negative cardio ROS Normal cardiovascular exam     Neuro/Psych PSYCHIATRIC DISORDERS Anxiety Depression negative neurological ROS     GI/Hepatic negative GI ROS, (+)     substance abuse  , Hepatitis -, C  Endo/Other    Renal/GU negative Renal ROS     Musculoskeletal   Abdominal   Peds  Hematology   Anesthesia Other Findings   Reproductive/Obstetrics                            Anesthesia Physical Anesthesia Plan  ASA: III and emergent  Anesthesia Plan: General   Post-op Pain Management:    Induction: Intravenous  PONV Risk Score and Plan: 3 and Ondansetron, Dexamethasone and Diphenhydramine  Airway Management Planned: Oral ETT  Additional Equipment:   Intra-op Plan:   Post-operative Plan: Extubation in OR  Informed Consent: I have reviewed the patients History and Physical, chart, labs and discussed the procedure including the risks, benefits and alternatives for the proposed anesthesia with the patient or authorized representative who has indicated his/her understanding and acceptance.   Dental advisory given  Plan Discussed with: CRNA, Anesthesiologist and Surgeon  Anesthesia Plan Comments:        Anesthesia Quick Evaluation

## 2016-11-16 NOTE — Progress Notes (Addendum)
Patient arrived to the unit from the emergency department.  Patient is alert and oriented x 4.  Complaints of pain above her left eye.  Vital signs: Temp: 98.1; BP: 123/79; Pulse: 63 ; resp 19 and 100 % on room air. Skin assessment complete.  Swelling and redness to the left upper eye lid.  IV intact to the right and left hand.  Educated the patient on how to reach the staff on the unit.  Lowered the bed and placed the call light within reach.  Will continue to monitor the patient.

## 2016-11-16 NOTE — Transfer of Care (Signed)
Immediate Anesthesia Transfer of Care Note  Patient: Alexa Sims  Procedure(s) Performed: INCISION AND DRAINAGE OF LEFT ORBITAL ABSCESS LEFT (Left Eye)  Patient Location: PACU  Anesthesia Type:General  Level of Consciousness: awake, alert , oriented and patient cooperative  Airway & Oxygen Therapy: Patient Spontanous Breathing and Patient connected to nasal cannula oxygen  Post-op Assessment: Report given to RN and Post -op Vital signs reviewed and stable  Post vital signs: Reviewed and stable  Last Vitals:  Vitals:   11/15/16 2218 11/16/16 0601  BP: 123/79 (!) 105/56  Pulse: 63 (!) 54  Resp: 19 18  Temp: 36.7 C 36.7 C  SpO2: 100% 99%    Last Pain:  Vitals:   11/16/16 0601  TempSrc: Oral  PainSc:       Patients Stated Pain Goal: 3 (11/16/16 0313)  Complications: No apparent anesthesia complications

## 2016-11-17 ENCOUNTER — Encounter (HOSPITAL_COMMUNITY): Payer: Self-pay | Admitting: Ophthalmology

## 2016-11-17 MED ORDER — ACETAMINOPHEN 325 MG PO TABS
650.0000 mg | ORAL_TABLET | Freq: Once | ORAL | Status: AC
  Administered 2016-11-17: 650 mg via ORAL
  Filled 2016-11-17: qty 2

## 2016-11-17 MED ORDER — IBUPROFEN 600 MG PO TABS: 600.0000 mg | ORAL_TABLET | Freq: Four times a day (QID) | ORAL | 0 refills | Status: DC | PRN

## 2016-11-17 MED ORDER — NICOTINE 14 MG/24HR TD PT24: 14.0000 mg | MEDICATED_PATCH | Freq: Every day | TRANSDERMAL | 0 refills | Status: DC

## 2016-11-17 MED ORDER — SULFAMETHOXAZOLE-TRIMETHOPRIM 800-160 MG PO TABS: 1.0000 | ORAL_TABLET | Freq: Two times a day (BID) | ORAL | 0 refills | Status: AC

## 2016-11-17 NOTE — Discharge Summary (Signed)
Physician Discharge Summary  Alexa Moulderaula Vieth WUJ:[id number] DOB: 06/01/1970 DOA: 11/15/2016  PCP: Caroline MorePrince, Elizabeth, PA-C  Admit date: 11/15/2016 Discharge date: 11/17/2016  Admitted From: Home Disposition: Home  Recommendations for Outpatient Follow-up:  1. Follow up with PCP in 1 week 2. Follow-up with ophthalmology in 2-3 days 3. Please obtain BMP/CBC in one week 4. Please follow up on the following pending results: Wound culture sensitivities  Home Health: None Equipment/Devices: None  Discharge Condition: Stable CODE STATUS: Full code Diet recommendation: Regular diet   Brief/Interim Summary:  Admission HPI written by Olivia Canterichard Scott Groat, MD   Chief Complaint: left eye pain  HPI: Alexa Sims is a 46 y.o. female with medical history significant for IVDU and chronic hep c presents with  About one week ago sitting on back deck and stung by wasp above left eye. Awoke the next morning and there was a tender nodule there that over this past week has grown in size. Painful. Including when moves eye. No drainage from eye or other site. Has treated at home w/ benedryl to no avail. Last ivdu was in February, denies other substances. Hx hep c, denies hx cirrhosis, no hx variceal bleed, no history ascites. Subjective fevers at home a few nights ago. Occasional nausea. Otherwise feeling well.  ED Course: CT, clindamycin, ophtho consult.    Hospital course:  Preseptal cellulitis Abscess Patient is s/p I&D by opthalmology this on 10/20. Empirically covered with vancomycin/cefpodoxime, transitioned to Bactrim/cefpodoxime. Wound culture at time of discharge staph aureus, so patient discharged on Bactrim in case wound grows MRSA (patient is MRSA nasal swab positive). Sensitivities pending. Outpatient follow-up with ophthalmology for follow-up.  Hepatitis C Transaminases wnl. HIV non-reactive. Outpatient follow-up.  Tobacco abuse -Continue nicotine patch  History of IV drug  abuse   Discharge Diagnoses:  Active Problems:   Polysubstance dependence including opioid type drug, continuous use (HCC)   Preseptal cellulitis of left upper eyelid    Discharge Instructions  Discharge Instructions    Increase activity slowly    Complete by:  As directed      Allergies as of 11/17/2016      Reactions   Tramadol Nausea And Vomiting, Other (See Comments)   Blurred vision      Medication List    TAKE these medications   diphenhydrAMINE 25 mg capsule Commonly known as:  BENADRYL Take 25 mg by mouth every 6 (six) hours as needed for allergies.   ibuprofen 600 MG tablet Commonly known as:  ADVIL,MOTRIN Take 1 tablet (600 mg total) by mouth every 6 (six) hours as needed for mild pain or moderate pain.   nicotine 14 mg/24hr patch Commonly known as:  NICODERM CQ - dosed in mg/24 hours Place 1 patch (14 mg total) onto the skin daily.   sulfamethoxazole-trimethoprim 800-160 MG tablet Commonly known as:  BACTRIM DS,SEPTRA DS Take 1 tablet by mouth every 12 (twelve) hours.      Follow-up Information    Caroline Morerince, Elizabeth, [place]. Schedule an appointment as soon as possible for a visit in 1 week(s).   Specialty:  Internal Medicine Contact information: [address] [place] [phone]        Olivia CanterGroat, Richard Scott, MD. Go in 2 day(s).   Specialty:  Ophthalmology Why:  Abscess follow-up. Contact information: [address] [place] [address] [phone]          Allergies  Allergen Reactions  . Tramadol Nausea And Vomiting and Other (See Comments)  Blurred vision    Consultations:  Ophthalmology   Procedures/Studies: Dg Chest Port 1 View  Result Date: 11/16/2016 CLINICAL DATA:  Central venous catheter placement. EXAM: PORTABLE CHEST 1 VIEW COMPARISON:  01/03/2012 FINDINGS: A right IJ central venous catheter is noted with tip overlying the mid SVC. There is no evidence of focal airspace disease, pulmonary edema,  suspicious pulmonary nodule/mass, pleural effusion, or pneumothorax. The cardiomediastinal silhouette is unremarkable. IMPRESSION: Right IJ central venous catheter with tip overlying the mid SVC. No evidence of pneumothorax. Electronically Signed   By: Harmon Pier M.D.   On: 11/16/2016 12:13   Ct Orbits W Contrast  Result Date: 11/15/2016 CLINICAL DATA:  Left periorbital swelling and redness over the last week. Headache. EXAM: CT ORBITS WITH CONTRAST TECHNIQUE: Multidetector CT images was performed according to the standard protocol following intravenous contrast administration. CONTRAST:  80mL ISOVUE-300 IOPAMIDOL (ISOVUE-300) INJECTION 61% COMPARISON:  08/12/2012 FINDINGS: Orbits: Pronounced swelling of the eye lid region and lateral face on the left with nonenhancing fluid collections, the largest measuring up to 2.7 cm in diameter, consistent with superficial abscess formation. No evidence of postseptal extension. Both globes appear normal. Optic nerves are normal. Extra-ocular muscles are normal. Intraorbital fat is normal. Orbital vascular structures appear intact. No evidence of cavernous sinus thrombosis. Visualized sinuses: Clear Soft tissues: Otherwise negative Limited intracranial: Normal IMPRESSION: Pronounced inflammatory change of the left upper eye lid and lateral face with swelling and nonenhancing fluid collections measuring up to 2.7 cm in diameter consistent with abscess formation. All this appears to be superficial inflammation, without any evidence of postseptal orbital pathology. Electronically Signed   By: Paulina Fusi M.D.   On: 11/15/2016 13:20       Subjective: Pain improved today. Swelling decreased.  Discharge Exam: Vitals:   11/16/16 2152 11/17/16 0518  BP: 126/77 105/89  Pulse: 74 67  Resp: 18 18  Temp: 98.2 F (36.8 C) 98.3 F (36.8 C)  SpO2: 99% 100%   Vitals:   11/16/16 1203 11/16/16 1218 11/16/16 2152 11/17/16 0518  BP: (!) 106/58 101/61 126/77 105/89   Pulse: (!) 53 (!) 56 74 67  Resp: 11 12 18 18   Temp:   98.2 F (36.8 C) 98.3 F (36.8 C)  TempSrc:   Oral Oral  SpO2: 99% 98% 99% 100%  Weight:      Height:        General: Pt is alert, awake, not in acute distress HEENT: left eyebrow less swollen, able to open eyelid. EOMI Cardiovascular: RRR, S1/S2 +, no rubs, no gallops Respiratory: CTA bilaterally, no wheezing, no rhonchi Abdominal: Soft, NT, ND, bowel sounds + Extremities: no edema, no cyanosis    The results of significant diagnostics from this hospitalization (including imaging, microbiology, ancillary and laboratory) are listed below for reference.     Microbiology: Recent Results (from the past 240 hour(s))  Surgical PCR screen     Status: Abnormal   Collection Time: 11/15/16 11:23 PM  Result Value Ref Range Status   MRSA, PCR POSITIVE (A) NEGATIVE Final    Comment: RCRV G.LOFTON RN 11/16/16 0539 L.CHAMPION    Staphylococcus aureus POSITIVE (A) NEGATIVE Final    Comment: (NOTE) The Xpert SA Assay (FDA approved for NASAL specimens in patients 32 years of age and older), is one component of a comprehensive surveillance program. It is not intended to diagnose infection nor to guide or monitor treatment.   Aerobic/Anaerobic Culture (surgical/deep wound)     Status: None (Preliminary result)  Collection Time: 11/16/16 11:10 AM  Result Value Ref Range Status   Specimen Description ABSCESS LEFT UPPER EYE  Final   Special Requests EYELID  Final   Gram Stain   Final    DEGENERATED CELLULAR MATERIAL PRESENT MODERATE GRAM POSITIVE COCCI IN CLUSTERS    Culture   Final    ABUNDANT STAPHYLOCOCCUS AUREUS SUSCEPTIBILITIES TO FOLLOW    Report Status PENDING  Incomplete     Labs: BNP (last 3 results) No results for input(s): BNP in the last 8760 hours. Basic Metabolic Panel:  Recent Labs Lab 11/15/16 1156  NA 137  K 4.0  CL 102  CO2 26  GLUCOSE 127*  BUN 12  CREATININE 0.95  CALCIUM 9.5   Liver  Function Tests:  Recent Labs Lab 11/16/16 0014  AST 23  ALT 25  ALKPHOS 55  BILITOT 0.5  PROT 6.6  ALBUMIN 3.2*   No results for input(s): LIPASE, AMYLASE in the last 168 hours. No results for input(s): AMMONIA in the last 168 hours. CBC:  Recent Labs Lab 11/15/16 1156  WBC 5.4  NEUTROABS 2.9  HGB 13.1  HCT 38.6  MCV 85.4  PLT 271   Cardiac Enzymes: No results for input(s): CKTOTAL, CKMB, CKMBINDEX, TROPONINI in the last 168 hours. BNP: Invalid input(s): POCBNP CBG: No results for input(s): GLUCAP in the last 168 hours. D-Dimer No results for input(s): DDIMER in the last 72 hours. Hgb A1c No results for input(s): HGBA1C in the last 72 hours. Lipid Profile No results for input(s): CHOL, HDL, LDLCALC, TRIG, CHOLHDL, LDLDIRECT in the last 72 hours. Thyroid function studies No results for input(s): TSH, T4TOTAL, T3FREE, THYROIDAB in the last 72 hours.  Invalid input(s): FREET3 Anemia work up No results for input(s): VITAMINB12, FOLATE, FERRITIN, TIBC, IRON, RETICCTPCT in the last 72 hours. Urinalysis    Component Value Date/Time   COLORURINE YELLOW 08/02/2012 1431   APPEARANCEUR CLOUDY (A) 08/02/2012 1431   LABSPEC 1.012 08/02/2012 1431   PHURINE 6.5 08/02/2012 1431   GLUCOSEU NEGATIVE 08/02/2012 1431   HGBUR LARGE (A) 08/02/2012 1431   BILIRUBINUR NEGATIVE 08/02/2012 1431   KETONESUR NEGATIVE 08/02/2012 1431   PROTEINUR NEGATIVE 08/02/2012 1431   UROBILINOGEN 0.2 08/02/2012 1431   NITRITE NEGATIVE 08/02/2012 1431   LEUKOCYTESUR MODERATE (A) 08/02/2012 1431   Sepsis Labs Invalid input(s): PROCALCITONIN,  WBC,  LACTICIDVEN Microbiology Recent Results (from the past 240 hour(s))  Surgical PCR screen     Status: Abnormal   Collection Time: 11/15/16 11:23 PM  Result Value Ref Range Status   MRSA, PCR POSITIVE (A) NEGATIVE Final    Comment: RCRV G.LOFTON RN 11/16/16 0539 L.CHAMPION    Staphylococcus aureus POSITIVE (A) NEGATIVE Final    Comment:  (NOTE) The Xpert SA Assay (FDA approved for NASAL specimens in patients 67 years of age and older), is one component of a comprehensive surveillance program. It is not intended to diagnose infection nor to guide or monitor treatment.   Aerobic/Anaerobic Culture (surgical/deep wound)     Status: None (Preliminary result)   Collection Time: 11/16/16 11:10 AM  Result Value Ref Range Status   Specimen Description ABSCESS LEFT UPPER EYE  Final   Special Requests EYELID  Final   Gram Stain   Final    DEGENERATED CELLULAR MATERIAL PRESENT MODERATE GRAM POSITIVE COCCI IN CLUSTERS    Culture   Final    ABUNDANT STAPHYLOCOCCUS AUREUS SUSCEPTIBILITIES TO FOLLOW    Report Status PENDING  Incomplete     SIGNED:  Jacquelin Hawking, MD [facility] 11/17/2016, 12:45 PM Pager [phone]  If 7PM-7AM, please contact night-coverage [email or web address] Password [email or web address]

## 2016-11-17 NOTE — Progress Notes (Signed)
BRIEF PROGRESS NOTE  S: Patient awake, a little anxious but no distress. Denies pain. POD#1 s/p LUL incision/drainage.  O: LUL moderate residual erythema but no warmth today, nontender except directly over incision, packing in place.  A/P:  1) Left preseptal cellulitis - s/p incision/drainage (11/16/2016) - improving - Cultures pending. Gram stain notable for moderate GPCs in clusters. - Agree with continuing PO Bactrim/cefpodoxime for a full 10 days. - Okay for discharge from ophthalmology's perspective.  Patient will need to follow up with me in 2-3 days in the office. If she has any worsening whatsoever, she needs to contact me immediately. Keep packing in place until follow up.  [facility] [phone] [address] [place], [place] [address] Cell - [phone] Office - [phone]  R Fabian SharpScott Christon Gallaway, MD

## 2016-11-17 NOTE — Progress Notes (Signed)
Patient didn't wait for d/c orders and left just prior to orders being placed. AMA paper completed and in chart. Madelin RearLonnie Yakov Bergen, MSN, RN, [employer]RN

## 2016-11-17 NOTE — Discharge Instructions (Signed)

## 2016-11-21 LAB — AEROBIC/ANAEROBIC CULTURE W GRAM STAIN (SURGICAL/DEEP WOUND)

## 2018-06-07 ENCOUNTER — Other Ambulatory Visit: Payer: Self-pay

## 2018-06-07 ENCOUNTER — Encounter (HOSPITAL_BASED_OUTPATIENT_CLINIC_OR_DEPARTMENT_OTHER): Payer: Self-pay | Admitting: *Deleted

## 2018-06-07 ENCOUNTER — Emergency Department (HOSPITAL_BASED_OUTPATIENT_CLINIC_OR_DEPARTMENT_OTHER)
Admission: EM | Admit: 2018-06-07 | Discharge: 2018-06-07 | Disposition: A | Discharge status: Home / Self Care | Payer: Self-pay | Attending: Emergency Medicine | Admitting: Emergency Medicine

## 2018-06-07 DIAGNOSIS — F1910 Other psychoactive substance abuse, uncomplicated: Secondary | ICD-10-CM | POA: Insufficient documentation

## 2018-06-07 DIAGNOSIS — F329 Major depressive disorder, single episode, unspecified: Secondary | ICD-10-CM | POA: Insufficient documentation

## 2018-06-07 DIAGNOSIS — Z9049 Acquired absence of other specified parts of digestive tract: Secondary | ICD-10-CM | POA: Insufficient documentation

## 2018-06-07 DIAGNOSIS — F1990 Other psychoactive substance use, unspecified, uncomplicated: Secondary | ICD-10-CM

## 2018-06-07 DIAGNOSIS — F17210 Nicotine dependence, cigarettes, uncomplicated: Secondary | ICD-10-CM | POA: Insufficient documentation

## 2018-06-07 DIAGNOSIS — L03114 Cellulitis of left upper limb: Secondary | ICD-10-CM | POA: Insufficient documentation

## 2018-06-07 DIAGNOSIS — F419 Anxiety disorder, unspecified: Secondary | ICD-10-CM | POA: Insufficient documentation

## 2018-06-07 LAB — CBC WITH DIFFERENTIAL/PLATELET
Abs Immature Granulocytes: 0.02 10*3/uL (ref 0.00–0.07)
Basophils Absolute: 0 10*3/uL (ref 0.0–0.1)
Basophils Relative: 0 %
Eosinophils Absolute: 0.1 10*3/uL (ref 0.0–0.5)
Eosinophils Relative: 1 %
HCT: 40.5 % (ref 36.0–46.0)
Hemoglobin: 13 g/dL (ref 12.0–15.0)
Immature Granulocytes: 0 %
Lymphocytes Relative: 16 %
Lymphs Abs: 1.5 10*3/uL (ref 0.7–4.0)
MCH: 28.3 pg (ref 26.0–34.0)
MCHC: 32.1 g/dL (ref 30.0–36.0)
MCV: 88 fL (ref 80.0–100.0)
Monocytes Absolute: 0.6 10*3/uL (ref 0.1–1.0)
Monocytes Relative: 7 %
Neutro Abs: 7.2 10*3/uL (ref 1.7–7.7)
Neutrophils Relative %: 76 %
Platelets: 259 10*3/uL (ref 150–400)
RBC: 4.6 MIL/uL (ref 3.87–5.11)
RDW: 13.7 % (ref 11.5–15.5)
WBC: 9.5 10*3/uL (ref 4.0–10.5)
nRBC: 0 % (ref 0.0–0.2)

## 2018-06-07 LAB — BASIC METABOLIC PANEL
Anion gap: 9 (ref 5–15)
BUN: 12 mg/dL (ref 6–20)
CO2: 27 mmol/L (ref 22–32)
Calcium: 9.5 mg/dL (ref 8.9–10.3)
Chloride: 100 mmol/L (ref 98–111)
Creatinine, Ser: 0.8 mg/dL (ref 0.44–1.00)
GFR calc Af Amer: >60 mL/min (ref >60)
GFR calc non Af Amer: >60 mL/min (ref >60)
Glucose, Bld: 126 mg/dL — ABNORMAL HIGH (ref 70–99)
Potassium: 4 mmol/L (ref 3.5–5.1)
Sodium: 136 mmol/L (ref 135–145)

## 2018-06-07 MED ORDER — CLINDAMYCIN PHOSPHATE 600 MG/50ML IV SOLN
600.0000 mg | Freq: Once | INTRAVENOUS | Status: AC
  Administered 2018-06-07: 17:00:00 600 mg via INTRAVENOUS
  Filled 2018-06-07: qty 50

## 2018-06-07 MED ORDER — SODIUM CHLORIDE 0.9 % IV SOLN
INTRAVENOUS | Status: DC | PRN
  Administered 2018-06-07: 250 mL via INTRAVENOUS

## 2018-06-07 MED ORDER — IBUPROFEN 200 MG PO TABS
600.0000 mg | ORAL_TABLET | Freq: Once | ORAL | Status: AC
  Administered 2018-06-07: 17:00:00 600 mg via ORAL
  Filled 2018-06-07: qty 1

## 2018-06-07 MED ORDER — CLINDAMYCIN HCL 300 MG PO CAPS: 300.0000 mg | ORAL_CAPSULE | Freq: Four times a day (QID) | ORAL | 0 refills | Status: DC

## 2018-06-07 NOTE — ED Triage Notes (Addendum)
Pt reports left hand swelling x 3 days. Back of left hand red and edematous. States she cut her pinkie cleaning out a warehouse 4 days ago which developed a blister prior to her hand swelling. She admits heroin use and injecting her hand 1 week ago

## 2018-06-07 NOTE — ED Provider Notes (Signed)
[facility] EMERGENCY DEPARTMENT Provider Note   CSN: [id number] Arrival date & time: 06/07/18  1537    History   Chief Complaint Chief Complaint  Patient presents with  . Wound Infection    HPI Alexa Sims is a 48 y.o. female.     Patient is a 48 year old female with past medical history of anxiety, depression, hepatitis C, and IV drug abuse.  She presents today for evaluation of pain and swelling to her left hand.  This is worsened over the past several days.  She denies any specific injury or trauma and tells me she last injected into her hand 3 weeks ago.  She denies any fevers or chills.  Pain is worse when she flexes or extends her fingers and with palpation.  There are no alleviating factors.  The history is provided by the patient.    Past Medical History:  Diagnosis Date  . Anxiety   . Depression   . Hepatitis C   . Intravenous drug user   . Lumbar herniated disc 2011   L5  . No pertinent past medical history   . Panic attack   . Tobacco abuse     Patient Active Problem List   Diagnosis Date Noted  . Preseptal cellulitis of left upper eyelid 11/15/2016  . Major depressive disorder, recurrent episode, moderate (HCC) 07/31/2012    Class: Acute  . Polysubstance dependence (HCC) 07/31/2012  . Polysubstance dependence including opioid type drug, continuous use (HCC) 07/31/2012  . Right ankle injury 09/11/2010  . Right knee injury 09/11/2010    Past Surgical History:  Procedure Laterality Date  . CESAREAN SECTION  06/07/2011   Procedure: CESAREAN SECTION;  Surgeon: Loney Laurence, MD;  Location: [facility] ORS;  Service: Gynecology;  Laterality: N/A;  . CHOLECYSTECTOMY    . KNEE SURGERY     partial meniscectomy right knee  . RUPTURED GLOBE EXPLORATION AND REPAIR Left 11/16/2016   Procedure: INCISION AND DRAINAGE OF LEFT ORBITAL ABSCESS LEFT;  Surgeon: Olivia Canter, MD;  Location: [facility] OR;  Service: Ophthalmology;  Laterality: Left;     OB  History    Gravida  3   Para  3   Term  3   Preterm  0   AB  0   Living  3     SAB  0   TAB  0   Ectopic  0   Multiple  0   Live Births  1            Home Medications    Prior to Admission medications   Medication Sig Start Date End Date Taking? Authorizing Provider  diphenhydrAMINE (BENADRYL) 25 mg capsule Take 25 mg by mouth every 6 (six) hours as needed for allergies.    [provider]  ibuprofen (ADVIL,MOTRIN) 600 MG tablet Take 1 tablet (600 mg total) by mouth every 6 (six) hours as needed for mild pain or moderate pain. 11/17/16   Narda Bonds, MD  nicotine (NICODERM CQ - DOSED IN MG/24 HOURS) 14 mg/24hr patch Place 1 patch (14 mg total) onto the skin daily. 11/18/16   Narda Bonds, MD    Family History Family History  Problem Relation Age of Onset  . Birth defects Brother   . Hyperlipidemia Father   . Hypertension Father   . Heart attack Father   . Rheum arthritis Sister   . Sudden death Paternal Uncle   . Hypertension Maternal Grandmother   . Diabetes Neg  Hx   . Anesthesia problems Neg Hx     Social History Social History   Tobacco Use  . Smoking status: Current Every Day Smoker    Packs/day: 0.00    Years: 0.00    Pack years: 0.00    Types: Cigarettes  . Smokeless tobacco: Never Used  Substance Use Topics  . Alcohol use: Yes    Alcohol/week: 1.0 standard drinks    Types: 1 Standard drinks or equivalent per week    Comment: occ  . Drug use: Yes    Types: Heroin, Hydrocodone, Benzodiazepines, Opium, IV, Cocaine     Allergies   Tramadol   Review of Systems Review of Systems  All other systems reviewed and are negative.    Physical Exam Updated Vital Signs BP 137/86 (BP Location: Right Arm)   Pulse 81   Temp 98.5 F (36.9 C) (Oral)   Resp 18   Ht 5\' 4"  (1.626 m)   Wt 63.5 kg   LMP 05/13/2018   SpO2 100%   BMI 24.03 kg/m   Physical Exam Vitals signs and nursing note reviewed.  Constitutional:       General: She is not in acute distress.    Appearance: Normal appearance. She is not ill-appearing, toxic-appearing or diaphoretic.  HENT:     Head: Normocephalic and atraumatic.  Pulmonary:     Effort: Pulmonary effort is normal.  Musculoskeletal:     Comments: The left hand is noted to have significant swelling, induration, and warmth to the dorsal aspect.  There is tenderness to palpation.  Distal cap refill is brisk and motor and sensation are intact to all fingers.  Skin:    General: Skin is warm and dry.  Neurological:     Mental Status: She is alert and oriented to person, place, and time.          ED Treatments / Results  Labs (all labs ordered are listed, but only abnormal results are displayed) Labs Reviewed  BASIC METABOLIC PANEL  CBC WITH DIFFERENTIAL/PLATELET    EKG None  Radiology No results found.  Procedures Procedures (including critical care time)  Medications Ordered in ED Medications  clindamycin (CLEOCIN) IVPB 600 mg (has no administration in time range)     Initial Impression / Assessment and Plan / ED Course  I have reviewed the triage vital signs and the nursing notes.  Pertinent labs & imaging results that were available during my care of the patient were reviewed by me and considered in my medical decision making (see chart for details).  Patient presenting with complaints of left hand pain and swelling.  She has a history of IV drug abuse.  Patient appears to have cellulitis to the dorsum of the hand.  I do not feel definite fluctuance the a definite abscess.  Patient was given intravenous clindamycin and will be discharged with follow-up with hand surgery.  Case was discussed with Dr. Eulah PontMurphy who was covering for hand.  Patient will follow-up with him in the next 2 to 3 days, and return to the ER if her redness, pain, or swelling increase, or if she develops fevers, vomiting, or other issues.  Patient given the option of admission for IV  antibiotics versus oral antibiotics and discharge.  She prefers discharge over admission, but understands that this condition may worsen and she may need to return for orthopedic admission/consultation and possible surgery.  Final Clinical Impressions(s) / ED Diagnoses   Final diagnoses:  None  ED Discharge Orders    None       Geoffery Lyons, MD 06/07/18 [phone]

## 2018-06-07 NOTE — ED Notes (Signed)
ED Provider at bedside. 

## 2018-06-07 NOTE — Discharge Instructions (Addendum)
Begin taking clindamycin as prescribed.  Ibuprofen 600 mg every 6 hours as needed for pain.  Follow-up with orthopedic surgery if not improving in the next 2 days.  The contact information for Dr. Eulah Pont has been provided in this discharge summary for you to call and make these arrangements.    Return to the emergency department in the meantime if you develop worsening pain, high fever, increased swelling, or other new and concerning symptoms.

## 2019-03-15 IMAGING — CT CT ORBITS W/ CM
3 series · 14 of 47 positions shown, 16 images · IV contrast (iopamidol)
Comparison: 07/28/2012

CLINICAL DATA: Left periorbital swelling and redness over the last
week. Headache.

EXAM:
CT ORBITS WITH CONTRAST
TECHNIQUE: Multidetector CT images was performed according to the standard
protocol following intravenous contrast administration.
CONTRAST:  80mL [id number]-QWW IOPAMIDOL (EA8A4J-QWW) INJECTION 61%

[Series 3: orbits 2.0 h30s st · axial · 0.36mm/px · z∈[+534,+608]mm · 8 of 45 slices shown, 10 images]
[im 4/45  brain]
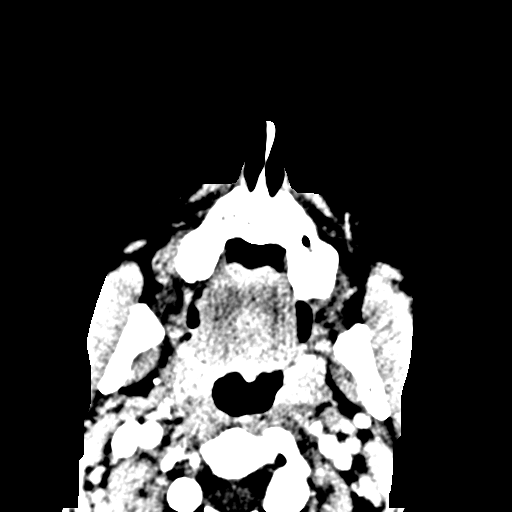
[im 4/45  bone]
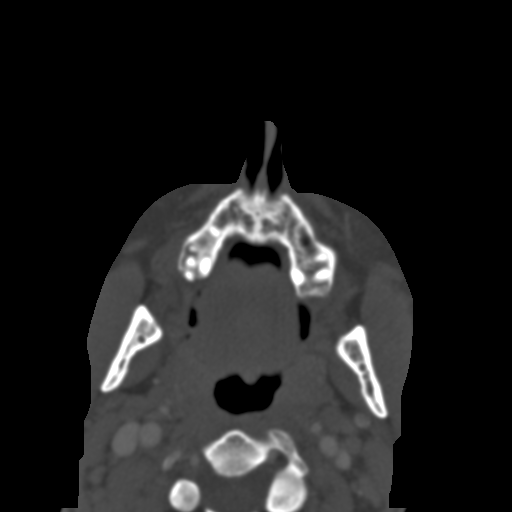
[im 10/45  bone]
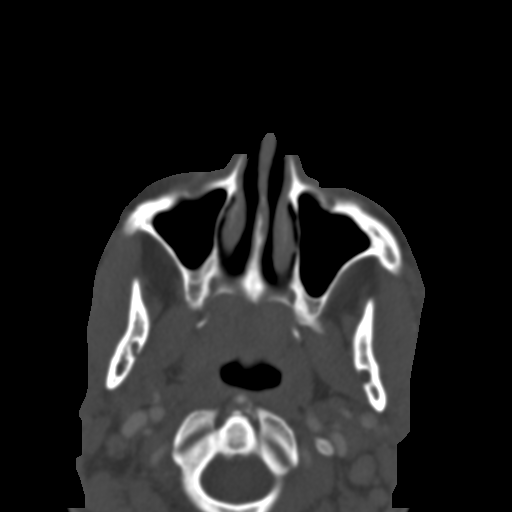
[im 14/45  bone]
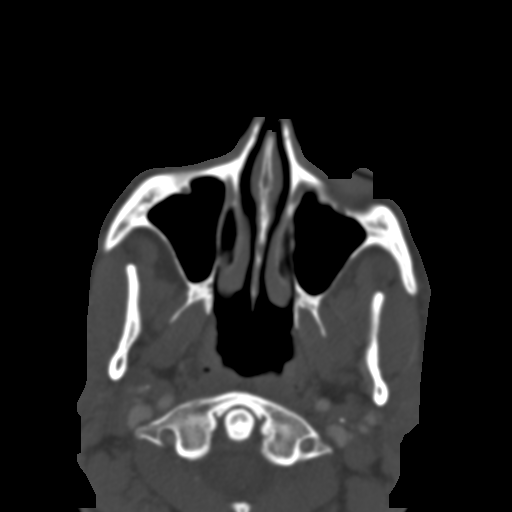
[im 20/45  bone]
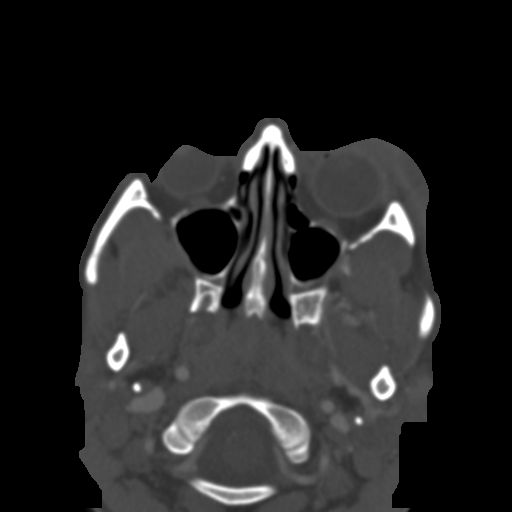
[im 25/45  brain]
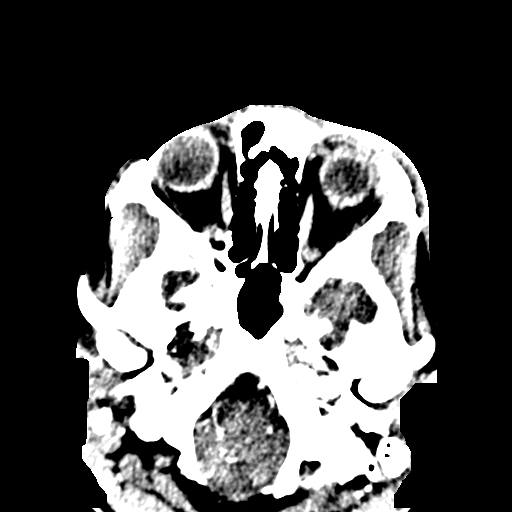
[im 25/45  bone]
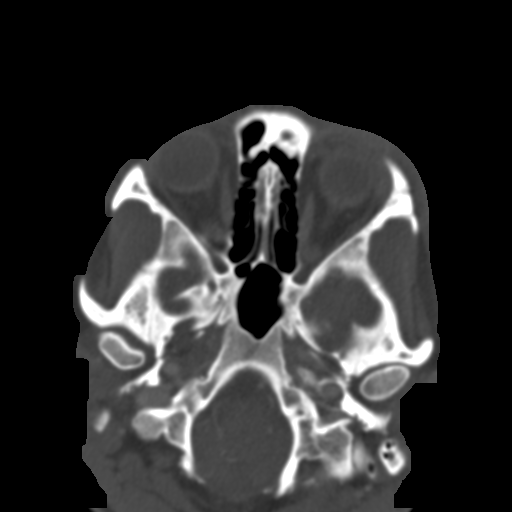
[im 31/45  bone]
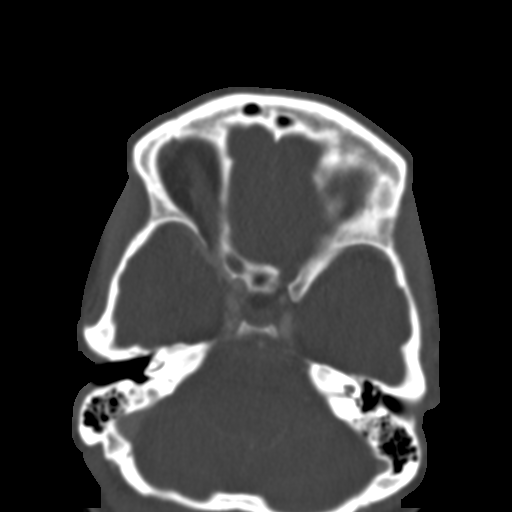
[im 35/45  bone]
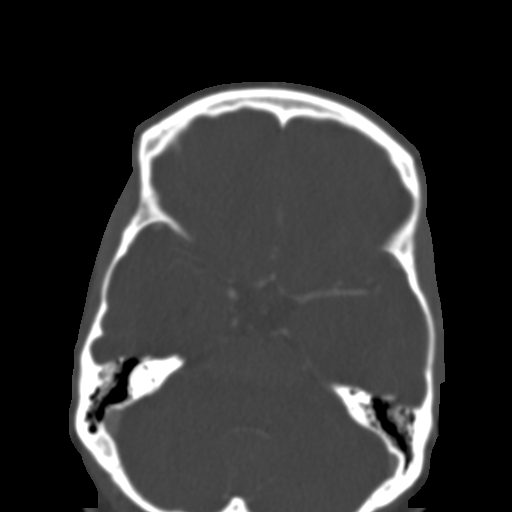
[im 41/45  bone]
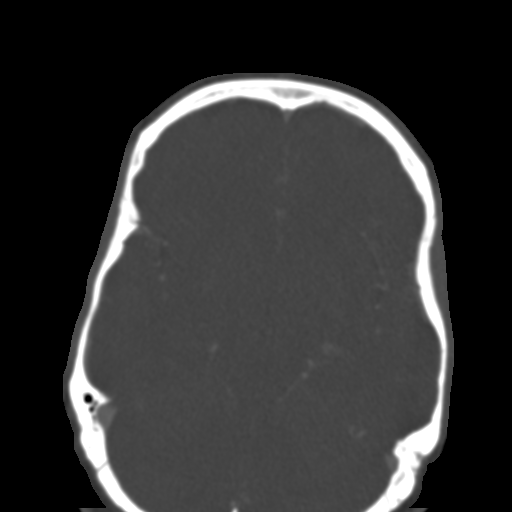

[Series 7: orbits 2.0 coronal · coronal · 0.25mm/px · 3 of 64 slices shown]
[im 22/64  bone]
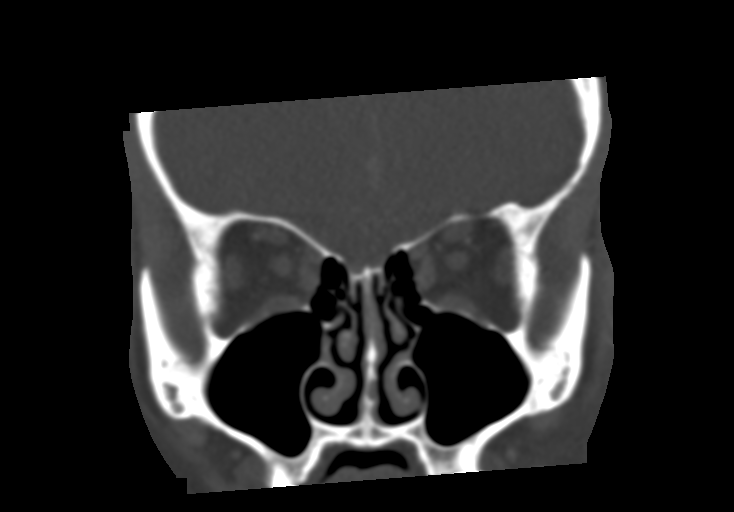
[im 29/64  bone]
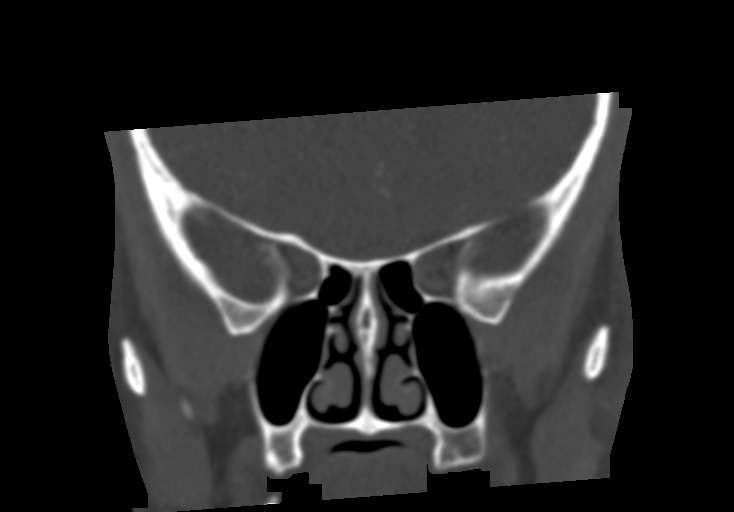
[im 36/64  bone]
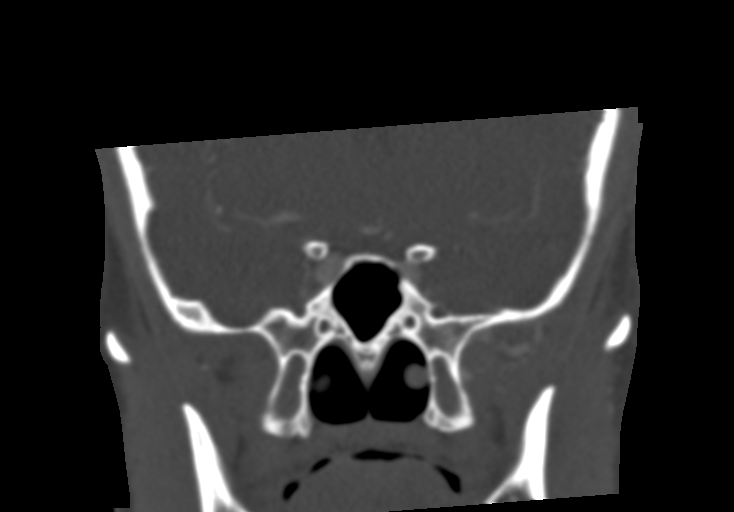

[Series 9: orbits 2.0 sagittal · sagittal · 0.24mm/px · 3 of 85 slices shown]
[im 29/85  bone]
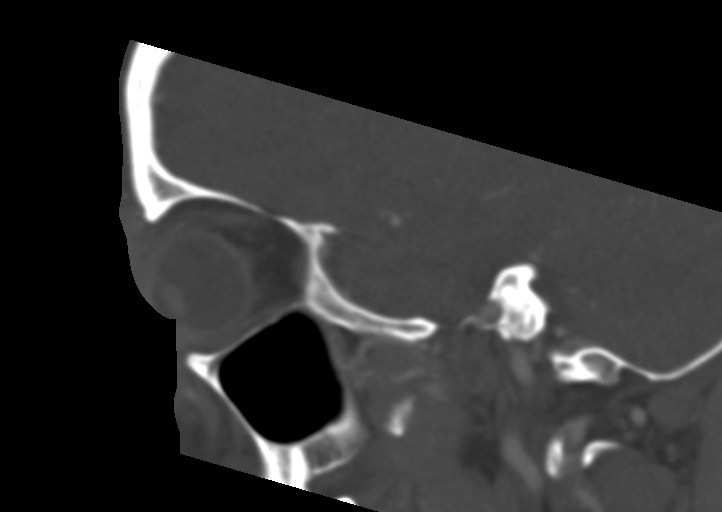
[im 43/85  bone]
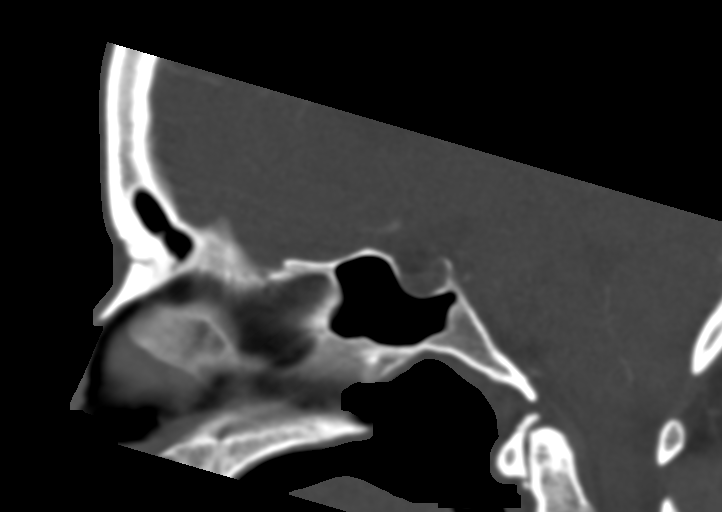
[im 57/85  bone]
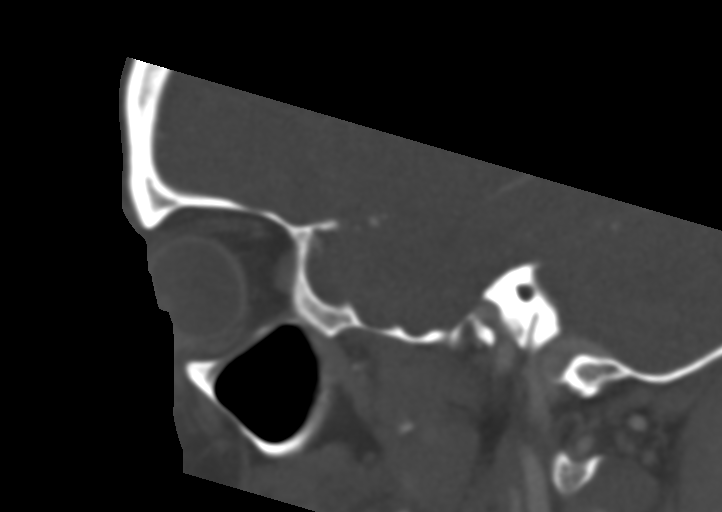

[14 of 47 positions shown; findings below may reference images not displayed]

FINDINGS: Orbits: Pronounced swelling of the eye lid region and lateral face
on the left with nonenhancing fluid collections, the largest
measuring up to 2.7 cm in diameter, consistent with superficial
abscess formation. No evidence of postseptal extension. Both globes
appear normal. Optic nerves are normal. Extra-ocular muscles are
normal. Intraorbital fat is normal. Orbital vascular structures
appear intact. No evidence of cavernous sinus thrombosis.

Visualized sinuses: Clear

Soft tissues: Otherwise negative

Limited intracranial: Normal
IMPRESSION: Pronounced inflammatory change of the left upper eye lid and lateral
face with swelling and nonenhancing fluid collections measuring up
to 2.7 cm in diameter consistent with abscess formation. All this
appears to be superficial inflammation, without any evidence of
postseptal orbital pathology.

## 2021-08-18 ENCOUNTER — Encounter (HOSPITAL_BASED_OUTPATIENT_CLINIC_OR_DEPARTMENT_OTHER): Payer: Self-pay | Admitting: Emergency Medicine

## 2021-08-18 ENCOUNTER — Emergency Department (HOSPITAL_BASED_OUTPATIENT_CLINIC_OR_DEPARTMENT_OTHER)
Admission: EM | Admit: 2021-08-18 | Discharge: 2021-08-19 | Disposition: A | Discharge status: Home / Self Care | Payer: 59 | Source: Home / Self Care | Attending: Emergency Medicine | Admitting: Emergency Medicine

## 2021-08-18 ENCOUNTER — Emergency Department (HOSPITAL_BASED_OUTPATIENT_CLINIC_OR_DEPARTMENT_OTHER)
Admission: EM | Admit: 2021-08-18 | Discharge: 2021-08-18 | Disposition: A | Discharge status: Home / Self Care | Payer: 59 | Attending: Emergency Medicine | Admitting: Emergency Medicine

## 2021-08-18 ENCOUNTER — Emergency Department (HOSPITAL_BASED_OUTPATIENT_CLINIC_OR_DEPARTMENT_OTHER): Payer: 59

## 2021-08-18 ENCOUNTER — Other Ambulatory Visit: Payer: Self-pay

## 2021-08-18 DIAGNOSIS — K047 Periapical abscess without sinus: Secondary | ICD-10-CM | POA: Insufficient documentation

## 2021-08-18 DIAGNOSIS — R220 Localized swelling, mass and lump, head: Secondary | ICD-10-CM

## 2021-08-18 DIAGNOSIS — K029 Dental caries, unspecified: Secondary | ICD-10-CM | POA: Diagnosis not present

## 2021-08-18 DIAGNOSIS — R519 Headache, unspecified: Secondary | ICD-10-CM | POA: Insufficient documentation

## 2021-08-18 DIAGNOSIS — F17210 Nicotine dependence, cigarettes, uncomplicated: Secondary | ICD-10-CM | POA: Diagnosis not present

## 2021-08-18 LAB — CBC WITH DIFFERENTIAL/PLATELET
Abs Immature Granulocytes: 0.02 10*3/uL (ref 0.00–0.07)
Basophils Absolute: 0 10*3/uL (ref 0.0–0.1)
Basophils Relative: 0 %
Eosinophils Absolute: 0.1 10*3/uL (ref 0.0–0.5)
Eosinophils Relative: 1 %
HCT: 41.9 % (ref 36.0–46.0)
Hemoglobin: 14.5 g/dL (ref 12.0–15.0)
Immature Granulocytes: 0 %
Lymphocytes Relative: 25 %
Lymphs Abs: 2.5 10*3/uL (ref 0.7–4.0)
MCH: 29.1 pg (ref 26.0–34.0)
MCHC: 34.6 g/dL (ref 30.0–36.0)
MCV: 84.1 fL (ref 80.0–100.0)
Monocytes Absolute: 0.9 10*3/uL (ref 0.1–1.0)
Monocytes Relative: 8 %
Neutro Abs: 6.8 10*3/uL (ref 1.7–7.7)
Neutrophils Relative %: 66 %
Platelets: 256 10*3/uL (ref 150–400)
RBC: 4.98 MIL/uL (ref 3.87–5.11)
RDW: 12.7 % (ref 11.5–15.5)
WBC: 10.3 10*3/uL (ref 4.0–10.5)
nRBC: 0 % (ref 0.0–0.2)

## 2021-08-18 LAB — BASIC METABOLIC PANEL
Anion gap: 7 (ref 5–15)
BUN: 11 mg/dL (ref 6–20)
CO2: 24 mmol/L (ref 22–32)
Calcium: 9.2 mg/dL (ref 8.9–10.3)
Chloride: 106 mmol/L (ref 98–111)
Creatinine, Ser: 0.74 mg/dL (ref 0.44–1.00)
GFR, Estimated: >60 mL/min (ref >60)
Glucose, Bld: 117 mg/dL — ABNORMAL HIGH (ref 70–99)
Potassium: 4.2 mmol/L (ref 3.5–5.1)
Sodium: 137 mmol/L (ref 135–145)

## 2021-08-18 MED ORDER — IOHEXOL 300 MG/ML  SOLN
80.0000 mL | Freq: Once | INTRAMUSCULAR | Status: AC | PRN
  Administered 2021-08-18: 80 mL via INTRAVENOUS

## 2021-08-18 MED ORDER — KETOROLAC TROMETHAMINE 15 MG/ML IJ SOLN
15.0000 mg | Freq: Once | INTRAMUSCULAR | Status: AC
  Administered 2021-08-18: 15 mg via INTRAVENOUS
  Filled 2021-08-18: qty 1

## 2021-08-18 MED ORDER — AMOXICILLIN 500 MG PO CAPS
500.0000 mg | ORAL_CAPSULE | Freq: Once | ORAL | Status: AC
Start: 2021-08-18 — End: 2021-08-18
  Administered 2021-08-18: 500 mg via ORAL
  Filled 2021-08-18: qty 1

## 2021-08-18 MED ORDER — SODIUM CHLORIDE 0.9 % IV SOLN
3.0000 g | Freq: Once | INTRAVENOUS | Status: AC
  Administered 2021-08-18: 3 g via INTRAVENOUS

## 2021-08-18 MED ORDER — CARBAMAZEPINE 200 MG PO TABS
200.0000 mg | ORAL_TABLET | Freq: Once | ORAL | Status: AC
  Administered 2021-08-18: 200 mg via ORAL
  Filled 2021-08-18: qty 1

## 2021-08-18 MED ORDER — FLUCONAZOLE 150 MG PO TABS: ORAL_TABLET | ORAL | 0 refills | Status: DC

## 2021-08-18 MED ORDER — CARBAMAZEPINE ER 200 MG PO TB12: 200.0000 mg | ORAL_TABLET | Freq: Two times a day (BID) | ORAL | 0 refills | Status: AC

## 2021-08-18 MED ORDER — AMOXICILLIN 500 MG PO CAPS: 500.0000 mg | ORAL_CAPSULE | Freq: Three times a day (TID) | ORAL | 0 refills | Status: DC

## 2021-08-18 NOTE — ED Triage Notes (Signed)
Pt c/o pain to upper pallet x 3 days with pain spreading to right side of face. Pt states she did puncture roof of mouth with a cracker.

## 2021-08-18 NOTE — ED Triage Notes (Signed)
Pt returns for increased facial swelling since this morning; sts pain is worse

## 2021-08-18 NOTE — ED Provider Notes (Signed)
[facility] EMERGENCY DEPARTMENT Provider Note   CSN: [id number] Arrival date & time: 08/18/21  1949     History  Chief Complaint  Patient presents with   Facial Swelling    Alexa Sims is a 51 y.o. female.  Alexa Sims is a 51 year old female with past medical history of hepatitis C and IV drug use who presents with 3 days of right-sided mouth and face pain with worsening pain and swelling over the past 12 hours.  She was seen here this morning at about 6 AM and prescribed amoxicillin for a suspected odontogenic infection and carbamazepine for suspected trigeminal neuralgia.  She states the lightninglike pain that she was feeling before discharge this morning has gone away.  However now there is no increased welling of the right face and she states the pain is spreading from her mouth and upper jaw to the side of her nose her right eye and in front of her right ear as well as a little bit in her right forehead.  She has also been having short spells of chills for the past few days.  She has been using heat packs and hot towels to relieve the pain on right side of her face she states without anything is a 10 out of 10 on the pain scale and when heat is applied it goes down to below a 7 out of 10.  She has taken roughly 1200 mg of ibuprofen today and used Orajel without satisfactory relief of her pain.  She has taken the second dose of her amoxicillin and carbamazepine after being given a dose of each this morning in the emergency department.        Home Medications Prior to Admission medications   Medication Sig Start Date End Date Taking? Authorizing Provider  amoxicillin (AMOXIL) 500 MG capsule Take 1 capsule (500 mg total) by mouth 3 (three) times daily. 08/18/21   Molpus, Jonny Ruiz, MD  carbamazepine (TEGRETOL-XR) 200 MG 12 hr tablet Take 1 tablet (200 mg total) by mouth 2 (two) times daily. 08/18/21   Molpus, John, MD  fluconazole (DIFLUCAN) 150 MG tablet Take 1 tablet as  needed for vaginal yeast infection.  May repeat in 3 days if symptoms persist 08/18/21   Molpus, Jonny Ruiz, MD      Allergies    Tramadol    Review of Systems   Review of Systems  Constitutional:  Positive for chills.  HENT:  Positive for dental problem, facial swelling and sinus pain. Negative for drooling, rhinorrhea and voice change.   Respiratory:  Negative for cough and shortness of breath.   Cardiovascular:  Negative for chest pain and leg swelling.  Gastrointestinal:  Negative for abdominal pain, constipation, diarrhea, nausea and vomiting.  Genitourinary:  Negative for difficulty urinating, dysuria and hematuria.  Musculoskeletal:  Negative for neck pain and neck stiffness.  Neurological:  Negative for dizziness, syncope and weakness.  Psychiatric/Behavioral:  Negative for confusion.     Physical Exam Updated Vital Signs BP 110/74   Pulse 86   Temp 99.3 F (37.4 C) (Oral)   Resp 16   LMP 05/13/2018   SpO2 100%  Physical Exam Vitals reviewed.  Constitutional:      General: She is not in acute distress. HENT:     Head: Normocephalic.     Comments: There is swelling without erythema of the right side of face from the lower orbit down to the mandible and extending from the nose to the preauricular area.  This  area is tender to palpation.    Mouth/Throat:     Pharynx: Uvula midline. No oropharyngeal exudate.     Tonsils: No tonsillar exudate.     Comments: There is mild edema in the right side of the mouth around the gumline and the cheek without any obvious sores or erythema or exudates.  There are completely eroded to the gumline and decaying molars on the right both top and bottom with mild surrounding edema without any surrounding erythema or purulent drainage.  Back left bottom molar is not completely eroded but has obvious dental caries without surrounding edema or erythema or drainage.  Anterior molar on the bottom left is completely eroded to the gumline and decayed without  surrounding edema, erythema, or drainage.  Most remaining teeth have dental caries but there is no surrounding erythema or purulent drainage. Cardiovascular:     Pulses:          Radial pulses are 2+ on the right side and 2+ on the left side.  Pulmonary:     Effort: Pulmonary effort is normal.  Musculoskeletal:     Cervical back: Normal range of motion.     Right lower leg: No edema.     Left lower leg: No edema.  Lymphadenopathy:     Cervical: No cervical adenopathy.  Skin:    Comments: There are some small healing excoriations on her forearms bilaterally consistent with cat scratches, there is no surrounding erythema or edema as well as no lymphatic tracking or many of the scratches.  Neurological:     Mental Status: She is alert.     Cranial Nerves: No cranial nerve deficit.     ED Results / Procedures / Treatments   Labs (all labs ordered are listed, but only abnormal results are displayed) Labs Reviewed  BASIC METABOLIC PANEL - Abnormal; Notable for the following components:      Result Value   Glucose, Bld 117 (*)    All other components within normal limits  CBC WITH DIFFERENTIAL/PLATELET    EKG None  Radiology No results found.  Procedures Procedures    Medications Ordered in ED Medications  ketorolac (TORADOL) 15 MG/ML injection 15 mg (15 mg Intravenous Given 08/18/21 2232)  Ampicillin-Sulbactam (UNASYN) 3 g in sodium chloride 0.9 % 100 mL IVPB (3 g Intravenous New Bag/Given 08/18/21 2235)  iohexol (OMNIPAQUE) 300 MG/ML solution 80 mL (80 mLs Intravenous Contrast Given 08/18/21 2309)    ED Course/ Medical Decision Making/ A&P                           Medical Decision Making Alexa Sims is a 51 year old female with a past medical history of hepatitis C and IV drug use who presents with worsening swelling and pain in her right side of her face from suspected odontogenic infection.  She was started on amoxicillin this morning after seeing ED physician here  returned after having worsening symptoms.  She has not been able to get a distance because of health insurance issues.  We checked a CBC and BMP which were unremarkable and showed no signs of systemic infection.  Due to the worsening nature of her symptoms and in conjunction with the multiple dental caries and eroded teeth we obtained a CT scan to evaluate for large abscesses.  Patient signed out to oncoming ED physician with plan to discharge on amoxicillin 500 mg 3 times a day if there is no evidence of abscess or  deep tissue infection requiring hospitalization seen on CT.  Problems Addressed: Dental infection: acute illness or injury Right facial swelling: acute illness or injury  Amount and/or Complexity of Data Reviewed Labs: ordered. Decision-making details documented in ED Course. Radiology: ordered and independent interpretation performed. Decision-making details documented in ED Course.  Risk Prescription drug management.           Final Clinical Impression(s) / ED Diagnoses Final diagnoses:  Dental infection  Right facial swelling    Rx / DC Orders ED Discharge Orders     None         Rocky Morel, DO 08/18/21 2332    Milagros Loll, MD 08/19/21 1536

## 2021-08-18 NOTE — ED Provider Notes (Signed)
[facility] Provider Note: Alexa Dell, MD, FACEP  CSN: [id number] MRN: [id number] ARRIVAL: 08/18/21 at 0608 ROOM: [facility]01/[facility]   CHIEF COMPLAINT  Facial Pain   HISTORY OF PRESENT ILLNESS  08/18/21 6:21 AM Alexa Sims is a 51 y.o. female with multiple missing and carious teeth.  She states she is intending to have her remaining teeth removed.  She is here with 3 days of right-sided lower facial pain.  She is not having a tooth ache but the gums on the right side are sensitive as is the right side of her face (cheek and jaw).  The pain has a constant component as well as an intermittent, lightninglike sharp pain.  This can be brought on by palpation or movement.  She is not having swelling of her face, erythema of her face, fever or chills.  She states she did puncture her hard palate while eating an animal cracker several days ago but is not sure if this triggered the pain.   Past Medical History:  Diagnosis Date   Anxiety    Depression    Hepatitis C    Intravenous drug user    Lumbar herniated disc 2011   L5   Panic attack    Tobacco abuse     Past Surgical History:  Procedure Laterality Date   CESAREAN SECTION  06/07/2011   Procedure: CESAREAN SECTION;  Surgeon: Loney Laurence, MD;  Location: [facility];  Service: Gynecology;  Laterality: N/A;   CHOLECYSTECTOMY     KNEE SURGERY     partial meniscectomy right knee   RUPTURED GLOBE EXPLORATION AND REPAIR Left 11/16/2016   Procedure: INCISION AND DRAINAGE OF LEFT ORBITAL ABSCESS LEFT;  Surgeon: Olivia Canter, MD;  Location: [facility];  Service: Ophthalmology;  Laterality: Left;    Family History  Problem Relation Age of Onset   Birth defects Brother    Hyperlipidemia Father    Hypertension Father    Heart attack Father    Rheum arthritis Sister    Sudden death Paternal Uncle    Hypertension Maternal Grandmother    Diabetes Neg Hx    Anesthesia problems Neg Hx     Social History   Tobacco Use    Smoking status: Every Day    Packs/day: 0.00    Years: 0.00    Total pack years: 0.00    Types: Cigarettes   Smokeless tobacco: Never  Substance Use Topics   Alcohol use: Yes    Alcohol/week: 1.0 standard drink of alcohol    Types: 1 Standard drinks or equivalent per week    Comment: occ   Drug use: Yes    Types: Heroin, Hydrocodone, Benzodiazepines, Opium, IV, Cocaine    Prior to Admission medications   Medication Sig Start Date End Date Taking? Authorizing Provider  amoxicillin (AMOXIL) 500 MG capsule Take 1 capsule (500 mg total) by mouth 3 (three) times daily. 08/18/21  Yes Lutricia Widjaja, MD  carbamazepine (TEGRETOL-XR) 200 MG 12 hr tablet Take 1 tablet (200 mg total) by mouth 2 (two) times daily. 08/18/21  Yes Aria Pickrell, MD  fluconazole (DIFLUCAN) 150 MG tablet Take 1 tablet as needed for vaginal yeast infection.  May repeat in 3 days if symptoms persist 08/18/21  Yes Lakysha Kossman, MD    Allergies Tramadol   REVIEW OF SYSTEMS  Negative except as noted here or in the History of Present Illness.   PHYSICAL EXAMINATION  Initial Vital Signs Blood pressure (!) 134/93, pulse 89, temperature  97.9 F (36.6 C), temperature source Oral, resp. rate 16, height 5\' 3"  (1.6 m), weight 77.1 kg, SpO2 99 %.  Examination General: Well-developed, well-nourished female in no acute distress; appearance consistent with age of record HENT: normocephalic; atraumatic; multiple missing teeth and multiple dental caries including right upper molars decayed to the gumline; tenderness and hyperesthesia of right cheek and jaw without erythema, warmth or fluctuance Eyes: Normal appearance Neck: supple Heart: regular rate and rhythm Lungs: clear to auscultation bilaterally Abdomen: soft; nondistended; nontender; bowel sounds present Extremities: No deformity; full range of motion Neurologic: Awake, alert and oriented; motor function intact in all extremities and symmetric; no facial droop Skin: Warm  and dry Psychiatric: Normal mood and affect   RESULTS  Summary of this visit's results, reviewed and interpreted by myself:   EKG Interpretation  Date/Time:    Ventricular Rate:    PR Interval:    QRS Duration:   QT Interval:    QTC Calculation:   R Axis:     Text Interpretation:         Laboratory Studies: No results found for this or any previous visit (from the past 24 hour(s)). Imaging Studies: No results found.  ED COURSE and MDM  Nursing notes, initial and subsequent vitals signs, including pulse oximetry, reviewed and interpreted by myself.  Vitals:   08/18/21 0617 08/18/21 0619  BP:  (!) 134/93  Pulse:  89  Resp:  16  Temp:  97.9 F (36.6 C)  TempSrc:  Oral  SpO2:  99%  Weight: 77.1 kg   Height: 5\' 3"  (1.6 m)    Medications  amoxicillin (AMOXIL) capsule 500 mg (has no administration in time range)  carbamazepine (TEGRETOL) tablet 200 mg (has no administration in time range)   The severely decayed teeth on the right upper side of the patient's mouth raises suspicions for an odontogenic infection.  She lacks, however, the swelling, erythema and warmth that would be expected with this.  We will start on amoxicillin for possible infectious etiology and as noted above she does not tend to have definitive dental extractions.  An alternative etiology would be trigeminal neuralgia.  This could explain the patient's hyperesthesia/allodynia and the sharp, lightninglike pain element.  We will start her on carbamazepine 200 mg twice daily.  We wish to avoid narcotics given her history of IV drug abuse.   PROCEDURES  Procedures   ED DIAGNOSES     ICD-10-CM   1. Facial pain  R51.9     2. Dental caries  K02.9          Genelda Roark, 08/20/21, MD 08/18/21 [phone]

## 2021-08-18 NOTE — Discharge Instructions (Addendum)
You are seen in the emergency department after he experienced worsening pain and swelling of your right face.  We checked lab work which showed no signs of electrolyte abnormalities or of any systemic infection.  We obtained a CT scan of your face and head which showed no large abscesses or other sources of infection that would require hospitalization at this time.  Recommend that you call the dentist for further evaluation and management, we have provided a name and number in this packet if needed.  Please also refer to the dental resource guide attached for information on other dentists in the area.  Please continue to take your amoxicillin 500 mg 3 times a day.  You may continue to take ibuprofen 200 mg every 4-6 hours for pain.  If you experience worsening swelling, pain, or other new or worsening symptoms please return to the emergency department.

## 2021-11-04 ENCOUNTER — Encounter (HOSPITAL_BASED_OUTPATIENT_CLINIC_OR_DEPARTMENT_OTHER): Payer: Self-pay | Admitting: Emergency Medicine

## 2021-11-04 ENCOUNTER — Emergency Department (HOSPITAL_BASED_OUTPATIENT_CLINIC_OR_DEPARTMENT_OTHER)
Admission: EM | Admit: 2021-11-04 | Discharge: 2021-11-04 | Disposition: A | Discharge status: Home / Self Care | Payer: Medicaid Other | Attending: Emergency Medicine | Admitting: Emergency Medicine

## 2021-11-04 ENCOUNTER — Other Ambulatory Visit: Payer: Self-pay

## 2021-11-04 DIAGNOSIS — L03211 Cellulitis of face: Secondary | ICD-10-CM | POA: Diagnosis not present

## 2021-11-04 DIAGNOSIS — J340 Abscess, furuncle and carbuncle of nose: Secondary | ICD-10-CM

## 2021-11-04 MED ORDER — OXYCODONE-ACETAMINOPHEN 5-325 MG PO TABS: 1.0000 | ORAL_TABLET | Freq: Four times a day (QID) | ORAL | 0 refills | Status: DC | PRN

## 2021-11-04 MED ORDER — DOXYCYCLINE HYCLATE 100 MG PO TABS
100.0000 mg | ORAL_TABLET | Freq: Once | ORAL | Status: AC
  Administered 2021-11-04: 100 mg via ORAL
  Filled 2021-11-04: qty 1

## 2021-11-04 MED ORDER — NAPROXEN 500 MG PO TABS: 500.0000 mg | ORAL_TABLET | Freq: Two times a day (BID) | ORAL | 0 refills | Status: DC

## 2021-11-04 MED ORDER — DOXYCYCLINE HYCLATE 100 MG PO CAPS: 100.0000 mg | ORAL_CAPSULE | Freq: Two times a day (BID) | ORAL | 0 refills | Status: DC

## 2021-11-04 MED ORDER — CEPHALEXIN 250 MG PO CAPS
500.0000 mg | ORAL_CAPSULE | Freq: Once | ORAL | Status: AC
  Administered 2021-11-04: 500 mg via ORAL
  Filled 2021-11-04: qty 2

## 2021-11-04 MED ORDER — CEPHALEXIN 500 MG PO CAPS: 500.0000 mg | ORAL_CAPSULE | Freq: Four times a day (QID) | ORAL | 0 refills | Status: DC

## 2021-11-04 MED ORDER — OXYCODONE-ACETAMINOPHEN 5-325 MG PO TABS
1.0000 | ORAL_TABLET | Freq: Once | ORAL | Status: AC
  Administered 2021-11-04: 1 via ORAL
  Filled 2021-11-04: qty 1

## 2021-11-04 NOTE — ED Triage Notes (Signed)
Pt reports abscess on nose x 3 days. Redness and swelling noted

## 2021-11-04 NOTE — ED Provider Notes (Signed)
[facility] EMERGENCY DEPARTMENT Provider Note   CSN: [id number] Arrival date & time: 11/04/21  0920     History  Chief Complaint  Patient presents with   Abscess    Alexa Sims is a 51 y.o. female.  Patient presents to the emergency department today for evaluation of nose swelling and redness.  Patient states that she did had a "pimple" inside the nose and the left nare starting 3 days ago.  This then worsened and now that tip of her nose is red and swollen.  She has pain that radiates to the bilateral cheeks.  No nosebleeds.  No fevers, nausea or vomiting.  She states that she has had an eye infection in the past that required surgery.  No history of diabetes or immunocompromise.       Home Medications Prior to Admission medications   Medication Sig Start Date End Date Taking? Authorizing Provider  cephALEXin (KEFLEX) 500 MG capsule Take 1 capsule (500 mg total) by mouth 4 (four) times daily. 11/04/21  Yes Renne Crigler, PA-C  doxycycline (VIBRAMYCIN) 100 MG capsule Take 1 capsule (100 mg total) by mouth 2 (two) times daily. 11/04/21  Yes Renne Crigler, PA-C  naproxen (NAPROSYN) 500 MG tablet Take 1 tablet (500 mg total) by mouth 2 (two) times daily. 11/04/21  Yes Renne Crigler, PA-C  oxyCODONE-acetaminophen (PERCOCET/ROXICET) 5-325 MG tablet Take 1 tablet by mouth every 6 (six) hours as needed for severe pain. 11/04/21  Yes Renne Crigler, PA-C  carbamazepine (TEGRETOL-XR) 200 MG 12 hr tablet Take 1 tablet (200 mg total) by mouth 2 (two) times daily. 08/18/21   Molpus, John, MD  fluconazole (DIFLUCAN) 150 MG tablet Take 1 tablet as needed for vaginal yeast infection.  May repeat in 3 days if symptoms persist 08/18/21   Molpus, Jonny Ruiz, MD      Allergies    Tramadol    Review of Systems   Review of Systems  Physical Exam Updated Vital Signs BP (!) 134/94 (BP Location: Left Arm)   Pulse 86   Temp 98.2 F (36.8 C) (Oral)   Resp 20   Wt 79.3 kg   LMP 05/13/2018    SpO2 96%   BMI 30.96 kg/m  Physical Exam Vitals and nursing note reviewed.  Constitutional:      Appearance: She is well-developed.  HENT:     Head: Normocephalic and atraumatic.     Nose:     Comments: There is generalized erythema of the tip of the nose.  Diffuse soft tissue swelling but no palpable abscess.  There is some fullness over the left anterior septum and internal nare, but no obvious fluctuance.  No active drainage.  Patient tender with insertion of speculum. Eyes:     Conjunctiva/sclera: Conjunctivae normal.  Pulmonary:     Effort: No respiratory distress.  Musculoskeletal:     Cervical back: Normal range of motion and neck supple.  Skin:    General: Skin is warm and dry.  Neurological:     Mental Status: She is alert.     ED Results / Procedures / Treatments   Labs (all labs ordered are listed, but only abnormal results are displayed) Labs Reviewed - No data to display  EKG None  Radiology No results found.  Procedures Procedures    Medications Ordered in ED Medications  doxycycline (VIBRA-TABS) tablet 100 mg (100 mg Oral Given 11/04/21 1016)  cephALEXin (KEFLEX) capsule 500 mg (500 mg Oral Given 11/04/21 1016)  oxyCODONE-acetaminophen (PERCOCET/ROXICET) 5-325  MG per tablet 1 tablet (1 tablet Oral Given 11/04/21 1016)    ED Course/ Medical Decision Making/ A&P    Patient seen and examined. History obtained directly from patient.   Labs/EKG: None ordered  Imaging: None ordered  Medications/Fluids: Ordered: Oral doxycycline, Keflex, Percocet  Most recent vital signs reviewed and are as follows: BP (!) 134/94 (BP Location: Left Arm)   Pulse 86   Temp 98.2 F (36.8 C) (Oral)   Resp 20   Wt 79.3 kg   LMP 05/13/2018   SpO2 96%   BMI 30.96 kg/m   Initial impression: Nasal cellulitis stemming from pustule  H&P, work-up, and plan reviewed with: Dr. Philip Aspen, agrees with plan  Home treatment plan: Warm compresses  Return instructions  discussed with patient: Return with worsening redness spreading, fever  Follow-up instructions discussed with patient: Referral for ENT provided, encouraged to call tomorrow to schedule follow-up appointment.  Discussed with patient that her symptoms may progress to the point where she needs incision and drainage of the abscess, but would prefer to have this done by ENT when needed.                          Medical Decision Making Risk Prescription drug management.   Patient with septal pustule that has expanded into cellulitis of the nose.  Appears to be localized at this time.  No necrosis.  No obvious abscess or fluctuance at this time.  No signs of sepsis.  Do not feel that patient requires emergent ENT evaluation today based on no obvious drainable abscess on exam.  Will start on oral antibiotics and have patient follow-up as outpatient.  She seems reliable to return if worsening.  She seems understand importance and need for follow-up.  The patient's vital signs, pertinent lab work and imaging were reviewed and interpreted as discussed in the ED course. Hospitalization was considered for further testing, treatments, or serial exams/observation. However as patient is well-appearing, has a stable exam, and reassuring studies today, I do not feel that they warrant admission at this time. This plan was discussed with the patient who verbalizes agreement and comfort with this plan and seems reliable and able to return to the Emergency Department with worsening or changing symptoms.          Final Clinical Impression(s) / ED Diagnoses Final diagnoses:  Cellulitis of nose    Rx / DC Orders ED Discharge Orders          Ordered    cephALEXin (KEFLEX) 500 MG capsule  4 times daily        11/04/21 1021    doxycycline (VIBRAMYCIN) 100 MG capsule  2 times daily        11/04/21 1021    oxyCODONE-acetaminophen (PERCOCET/ROXICET) 5-325 MG tablet  Every 6 hours PRN        11/04/21 1021     naproxen (NAPROSYN) 500 MG tablet  2 times daily        11/04/21 1021              Carlisle Cater, PA-C 11/04/21 1055    Fransico Meadow, MD 11/04/21 1527

## 2021-11-04 NOTE — Discharge Instructions (Addendum)
Please read and follow all provided instructions.  Your diagnoses today include:  1. Cellulitis of nose    Tests performed today include: Vital signs. See below for your results today.   Medications prescribed:  Keflex (cephalexin) - antibiotic  You have been prescribed an antibiotic medicine: take the entire course of medicine even if you are feeling better. Stopping early can cause the antibiotic not to work.  Doxycycline - antibiotic  You have been prescribed an antibiotic medicine: take the entire course of medicine even if you are feeling better. Stopping early can cause the antibiotic not to work.  Percocet (oxycodone/acetaminophen) - narcotic pain medication  DO NOT drive or perform any activities that require you to be awake and alert because this medicine can make you drowsy. BE VERY CAREFUL not to take multiple medicines containing Tylenol (also called acetaminophen). Doing so can lead to an overdose which can damage your liver and cause liver failure and possibly death.  Take any prescribed medications only as directed.   Home care instructions:  Follow any educational materials contained in this packet  Follow-up instructions: Return to the Emergency Department in 48 hours for a recheck if your symptoms are not significantly improved.  Please follow-up with your primary care provider in the next 1 week for further evaluation of your symptoms.   Return instructions:  Return to the Emergency Department if you have: Fever Worsening symptoms Worsening pain Worsening swelling Redness of the skin that moves away from the affected area, especially if it streaks away from the affected area  Any other emergent concerns  Your vital signs today were: BP (!) 134/94 (BP Location: Left Arm)   Pulse 86   Temp 98.2 F (36.8 C) (Oral)   Resp 20   Wt 79.3 kg   LMP 05/13/2018   SpO2 96%   BMI 30.96 kg/m  If your blood pressure (BP) was elevated above 135/85 this visit,  please have this repeated by your doctor within one month. --------------

## 2022-03-03 DIAGNOSIS — L0201 Cutaneous abscess of face: Secondary | ICD-10-CM | POA: Insufficient documentation

## 2022-03-04 ENCOUNTER — Encounter (HOSPITAL_BASED_OUTPATIENT_CLINIC_OR_DEPARTMENT_OTHER): Payer: Self-pay

## 2022-03-04 ENCOUNTER — Emergency Department (HOSPITAL_BASED_OUTPATIENT_CLINIC_OR_DEPARTMENT_OTHER)
Admission: EM | Admit: 2022-03-04 | Discharge: 2022-03-04 | Disposition: A | Discharge status: Home / Self Care | Payer: Medicaid Other | Attending: Emergency Medicine | Admitting: Emergency Medicine

## 2022-03-04 ENCOUNTER — Other Ambulatory Visit: Payer: Self-pay

## 2022-03-04 DIAGNOSIS — L0201 Cutaneous abscess of face: Secondary | ICD-10-CM

## 2022-03-04 MED ORDER — DOXYCYCLINE HYCLATE 100 MG PO TABS
100.0000 mg | ORAL_TABLET | Freq: Once | ORAL | Status: AC
  Administered 2022-03-04: 100 mg via ORAL
  Filled 2022-03-04: qty 1

## 2022-03-04 MED ORDER — DOXYCYCLINE HYCLATE 100 MG PO CAPS: 100.0000 mg | ORAL_CAPSULE | Freq: Two times a day (BID) | ORAL | 0 refills | Status: AC

## 2022-03-04 MED ORDER — IBUPROFEN 400 MG PO TABS
400.0000 mg | ORAL_TABLET | Freq: Once | ORAL | Status: AC
  Administered 2022-03-04: 400 mg via ORAL
  Filled 2022-03-04: qty 1

## 2022-03-04 NOTE — ED Triage Notes (Signed)
Patient states she has a bump on the left lower side of her face. Patient states the area is swollen and painful, 5/10. Patient took tylenol and ibuprofen for pain.

## 2022-03-04 NOTE — ED Provider Notes (Signed)
[facility] Provider Note   CSN: [id number] Arrival date & time: 03/03/22  2359     History  Chief Complaint  Patient presents with   Facial Pain    Alexa Sims is a 52 y.o. female.  The history is provided by the patient.  Abscess Location:  Face Abscess quality: draining, induration, painful and warmth   Duration:  2 days Progression:  Worsening Chronicity:  New Context: not diabetes   Relieved by:  Nothing Associated symptoms: no fever and no vomiting   Patient reports onset of abscess to left side of her face about 2 days ago.  She reports it is swelling.  No fevers or vomiting.  It is now starting to drain.  No difficulty swallowing.  No visual changes.     Home Medications Prior to Admission medications   Medication Sig Start Date End Date Taking? Authorizing Provider  doxycycline (VIBRAMYCIN) 100 MG capsule Take 1 capsule (100 mg total) by mouth 2 (two) times daily. One po bid x 7 days 03/04/22  Yes Ripley Fraise, MD  carbamazepine (TEGRETOL-XR) 200 MG 12 hr tablet Take 1 tablet (200 mg total) by mouth 2 (two) times daily. 08/18/21   Molpus, John, MD      Allergies    Tramadol    Review of Systems   Review of Systems  Constitutional:  Negative for fever.  Eyes:  Negative for visual disturbance.  Gastrointestinal:  Negative for vomiting.    Physical Exam Updated Vital Signs BP 98/74   Pulse 85   Temp 98.1 F (36.7 C) (Oral)   Resp 17   Ht 1.575 m (5\' 2" )   Wt 59 kg   LMP 05/13/2018   SpO2 100%   BMI 23.78 kg/m  Physical Exam CONSTITUTIONAL: Well developed/well nourished, no distress HEAD: Normocephalic/atraumatic EYES: EOMI/PERRL, no proptosis ENMT: Mucous membranes moist Small draining abscess noted inferior and lateral to the nose.  No crepitus.  Mild tenderness noted.  No significant fluctuance.  No intraoral abscesses noted.  No angioedema No other rash noted to the face or head NEURO: Pt is  awake/alert/appropriate, moves all extremitiesx4.  No facial droop.   SKIN: warm, color normal PSYCH: Mildly anxious  ED Results / Procedures / Treatments   Labs (all labs ordered are listed, but only abnormal results are displayed) Labs Reviewed - No data to display  EKG None  Radiology No results found.  Procedures Procedures    Medications Ordered in ED Medications  doxycycline (VIBRA-TABS) tablet 100 mg (has no administration in time range)  ibuprofen (ADVIL) tablet 400 mg (has no administration in time range)    ED Course/ Medical Decision Making/ A&P                             Medical Decision Making Risk Prescription drug management.   Patient presents with pain and swelling to the left side of her face from a small abscess.  It is already draining.  Patient's had these several times before.  Will plan to start on oral antibiotics.  No indication for bedside I&D at this time.  We discussed return precautions        Final Clinical Impression(s) / ED Diagnoses Final diagnoses:  Facial abscess    Rx / DC Orders ED Discharge Orders          Ordered    doxycycline (VIBRAMYCIN) 100 MG capsule  2  times daily        03/04/22 0023              Ripley Fraise, MD 03/04/22 [phone]

## 2022-05-13 ENCOUNTER — Emergency Department (HOSPITAL_BASED_OUTPATIENT_CLINIC_OR_DEPARTMENT_OTHER): Payer: Medicaid Other

## 2022-05-13 ENCOUNTER — Other Ambulatory Visit: Payer: Self-pay

## 2022-05-13 ENCOUNTER — Encounter (HOSPITAL_BASED_OUTPATIENT_CLINIC_OR_DEPARTMENT_OTHER): Payer: Self-pay | Admitting: Emergency Medicine

## 2022-05-13 ENCOUNTER — Emergency Department (HOSPITAL_BASED_OUTPATIENT_CLINIC_OR_DEPARTMENT_OTHER)
Admission: EM | Admit: 2022-05-13 | Discharge: 2022-05-14 | Disposition: A | Discharge status: Other Acute Inpatient Hospital | Payer: Medicaid Other | Attending: Emergency Medicine | Admitting: Emergency Medicine

## 2022-05-13 DIAGNOSIS — L0201 Cutaneous abscess of face: Secondary | ICD-10-CM

## 2022-05-13 DIAGNOSIS — K122 Cellulitis and abscess of mouth: Secondary | ICD-10-CM | POA: Diagnosis not present

## 2022-05-13 LAB — COMPREHENSIVE METABOLIC PANEL
ALT: 41 U/L (ref 0–44)
AST: 35 U/L (ref 15–41)
Albumin: 3.8 g/dL (ref 3.5–5.0)
Alkaline Phosphatase: 71 U/L (ref 38–126)
Anion gap: 9 (ref 5–15)
BUN: 13 mg/dL (ref 6–20)
CO2: 29 mmol/L (ref 22–32)
Calcium: 9 mg/dL (ref 8.9–10.3)
Chloride: 98 mmol/L (ref 98–111)
Creatinine, Ser: 0.77 mg/dL (ref 0.44–1.00)
GFR, Estimated: >60 mL/min (ref >60)
Glucose, Bld: 130 mg/dL — ABNORMAL HIGH (ref 70–99)
Potassium: 4 mmol/L (ref 3.5–5.1)
Sodium: 136 mmol/L (ref 135–145)
Total Bilirubin: 0.5 mg/dL (ref 0.3–1.2)
Total Protein: 7.3 g/dL (ref 6.5–8.1)

## 2022-05-13 LAB — CBC WITH DIFFERENTIAL/PLATELET
Abs Immature Granulocytes: 0.01 10*3/uL (ref 0.00–0.07)
Basophils Absolute: 0 10*3/uL (ref 0.0–0.1)
Basophils Relative: 0 %
Eosinophils Absolute: 0.1 10*3/uL (ref 0.0–0.5)
Eosinophils Relative: 2 %
HCT: 40.2 % (ref 36.0–46.0)
Hemoglobin: 13.2 g/dL (ref 12.0–15.0)
Immature Granulocytes: 0 %
Lymphocytes Relative: 24 %
Lymphs Abs: 1.7 10*3/uL (ref 0.7–4.0)
MCH: 28.6 pg (ref 26.0–34.0)
MCHC: 32.8 g/dL (ref 30.0–36.0)
MCV: 87.2 fL (ref 80.0–100.0)
Monocytes Absolute: 0.5 10*3/uL (ref 0.1–1.0)
Monocytes Relative: 7 %
Neutro Abs: 4.8 10*3/uL (ref 1.7–7.7)
Neutrophils Relative %: 67 %
Platelets: 261 10*3/uL (ref 150–400)
RBC: 4.61 MIL/uL (ref 3.87–5.11)
RDW: 13.2 % (ref 11.5–15.5)
WBC: 7.1 10*3/uL (ref 4.0–10.5)
nRBC: 0 % (ref 0.0–0.2)

## 2022-05-13 LAB — LACTIC ACID, PLASMA: Lactic Acid, Venous: 0.8 mmol/L (ref 0.5–1.9)

## 2022-05-13 MED ORDER — LACTATED RINGERS IV BOLUS
1000.0000 mL | Freq: Once | INTRAVENOUS | Status: AC
  Administered 2022-05-13: 1000 mL via INTRAVENOUS

## 2022-05-13 MED ORDER — FENTANYL CITRATE PF 50 MCG/ML IJ SOSY
50.0000 ug | PREFILLED_SYRINGE | Freq: Once | INTRAMUSCULAR | Status: AC
  Administered 2022-05-13: 50 ug via INTRAVENOUS
  Filled 2022-05-13: qty 1

## 2022-05-13 MED ORDER — SODIUM CHLORIDE 0.9 % IV SOLN: 3.0000 g | Freq: Four times a day (QID) | INTRAVENOUS | Status: DC

## 2022-05-13 MED ORDER — DEXAMETHASONE SODIUM PHOSPHATE 10 MG/ML IJ SOLN
10.0000 mg | Freq: Once | INTRAMUSCULAR | Status: AC
  Administered 2022-05-13: 10 mg via INTRAVENOUS
  Filled 2022-05-13: qty 1

## 2022-05-13 MED ORDER — SODIUM CHLORIDE 0.9 % IV SOLN
3.0000 g | Freq: Once | INTRAVENOUS | Status: AC
  Administered 2022-05-13: 3 g via INTRAVENOUS

## 2022-05-13 MED ORDER — IOHEXOL 300 MG/ML  SOLN
75.0000 mL | Freq: Once | INTRAMUSCULAR | Status: AC | PRN
  Administered 2022-05-13: 75 mL via INTRAVENOUS

## 2022-05-13 MED ORDER — VANCOMYCIN HCL 1250 MG/250ML IV SOLN
1250.0000 mg | INTRAVENOUS | Status: DC
  Filled 2022-05-13: qty 250

## 2022-05-13 MED ORDER — VANCOMYCIN HCL 500 MG IV SOLR
500.0000 mg | Freq: Once | INTRAVENOUS | Status: AC
  Administered 2022-05-13: 500 mg via INTRAVENOUS

## 2022-05-13 MED ORDER — VANCOMYCIN HCL IN DEXTROSE 1-5 GM/200ML-% IV SOLN
1000.0000 mg | Freq: Once | INTRAVENOUS | Status: AC
  Administered 2022-05-13: 1000 mg via INTRAVENOUS
  Filled 2022-05-13: qty 200

## 2022-05-13 NOTE — ED Provider Notes (Addendum)
[facility] Provider Note   CSN: [id number] Arrival date & time: 05/13/22  1603     History  Chief Complaint  Patient presents with   Abscess    chin    Alexa Sims is a 52 y.o. female.  Patient on reassessment   Abscess    52 year old female with medical history significant for anxiety, depression, IV drug abuse, hepatitis C who presents to the emergency department with a chin abscess.  The patient states that for the last 3 days she has noticed worsening pain and swelling underneath her chin.  Her neck has become firm and tight and she feels significant pressure in her mouth.  She denies any difficulty swallowing, she does have difficulty breathing through her mouth but is able to breathe through her nose.  She does not feel like her throat is closing up.  She denies any fevers or chills.  She does endorse a history of abscesses in the setting of IV drug abuse.   Home Medications Prior to Admission medications   Medication Sig Start Date End Date Taking? Authorizing Provider  carbamazepine (TEGRETOL-XR) 200 MG 12 hr tablet Take 1 tablet (200 mg total) by mouth 2 (two) times daily. 08/18/21   Molpus, John, MD  doxycycline (VIBRAMYCIN) 100 MG capsule Take 1 capsule (100 mg total) by mouth 2 (two) times daily. One po bid x 7 days 03/04/22   Zadie Rhine, MD      Allergies    Tramadol    Review of Systems   Review of Systems  All other systems reviewed and are negative.   Physical Exam Updated Vital Signs BP 107/76   Pulse 62   Temp 98.4 F (36.9 C) (Oral)   Resp 10   Wt 59.9 kg   LMP 05/13/2018   SpO2 100%   BMI 24.14 kg/m  Physical Exam Vitals and nursing note reviewed.  Constitutional:      General: She is not in acute distress.    Appearance: She is well-developed.  HENT:     Head: Normocephalic and atraumatic.     Comments: Chin and neck soft tissue is firm, tight, warm, no clear fluctuance, no significant  tongue elevation, no stridor, range of motion of the neck intact, mild trismus Eyes:     Conjunctiva/sclera: Conjunctivae normal.  Cardiovascular:     Rate and Rhythm: Normal rate and regular rhythm.     Heart sounds: No murmur heard. Pulmonary:     Effort: Pulmonary effort is normal. No respiratory distress.     Breath sounds: Normal breath sounds.  Abdominal:     Palpations: Abdomen is soft.     Tenderness: There is no abdominal tenderness.  Musculoskeletal:        General: No swelling.     Cervical back: Neck supple.  Skin:    General: Skin is warm and dry.     Capillary Refill: Capillary refill takes less than 2 seconds.  Neurological:     Mental Status: She is alert.  Psychiatric:        Mood and Affect: Mood normal.    ED Results / Procedures / Treatments   Labs (all labs ordered are listed, but only abnormal results are displayed) Labs Reviewed  COMPREHENSIVE METABOLIC PANEL - Abnormal; Notable for the following components:      Result Value   Glucose, Bld 130 (*)    All other components within normal limits  CULTURE, BLOOD (ROUTINE X 2)  CULTURE, BLOOD (ROUTINE X 2)  GROUP A STREP BY PCR  MRSA NEXT GEN BY PCR, NASAL  LACTIC ACID, PLASMA  CBC WITH DIFFERENTIAL/PLATELET    EKG None  Radiology CT Soft Tissue Neck W Contrast  Result Date: 05/13/2022 CLINICAL DATA:  Possible Ludwig's EXAM: CT NECK WITH CONTRAST TECHNIQUE: Multidetector CT imaging of the neck was performed using the standard protocol following the bolus administration of intravenous contrast. RADIATION DOSE REDUCTION: This exam was performed according to the departmental dose-optimization program which includes automated exposure control, adjustment of the mA and/or kV according to patient size and/or use of iterative reconstruction technique. CONTRAST:  75mL OMNIPAQUE IOHEXOL 300 MG/ML  SOLN COMPARISON:  None Available. FINDINGS: Pharynx and larynx: There is a 1.8 x 1.1 x 1.0 cm abscess in the  sublingual space (series 3, image 75). There is subcutaneous soft tissue stranding along the chin. The epiglottis is normal in appearance. Bilateral tonsils are normal in appearance. Salivary glands: No inflammation, mass, or stone. Thyroid: Normal. Lymph nodes: None enlarged or abnormal density. Vascular: Negative. Limited intracranial: Negative. Visualized orbits: Negative. Mastoids and visualized paranasal sinuses: No middle ear or mastoid effusion. Paranasal sinuses are clear. Orbits are unremarkable. Skeleton: No acute or aggressive process. Upper chest: Negative. Other: None. IMPRESSION: Inflammatory change and a 1.8 x 1.1 x 1.0 cm abscess in the sublingual space. 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 Electronically Signed   By: Lorenza Cambridge M.D.   On: 05/13/2022 19:58    Procedures .Critical Care  Performed by: Ernie Avena, MD Authorized by: Ernie Avena, MD   Critical care provider statement:    Critical care time (minutes):  44   Critical care was time spent personally by me on the following activities:  Development of treatment plan with patient or surrogate, discussions with consultants, evaluation of patient's response to treatment, examination of patient, ordering and review of laboratory studies, ordering and review of radiographic studies, ordering and performing treatments and interventions, pulse oximetry, re-evaluation of patient's condition and review of old charts   Care discussed with: accepting provider at another facility       Medications Ordered in ED Medications  Ampicillin-Sulbactam (UNASYN) 3 g in sodium chloride 0.9 % 100 mL IVPB (0 g Intravenous Stopped 05/13/22 2006)  lactated ringers bolus 1,000 mL (1,000 mLs Intravenous New Bag/Given 05/13/22 2013)  vancomycin (VANCOCIN) IVPB 1000 mg/200 mL premix (1,000 mg Intravenous New Bag/Given 05/13/22 2007)    Followed by  vancomycin (VANCOCIN) 500 mg in sodium chloride 0.9 % 100 mL IVPB (500 mg  Intravenous New Bag/Given 05/13/22 2009)  iohexol (OMNIPAQUE) 300 MG/ML solution 75 mL (75 mLs Intravenous Contrast Given 05/13/22 1933)  dexamethasone (DECADRON) injection 10 mg (10 mg Intravenous Given 05/13/22 2045)  fentaNYL (SUBLIMAZE) injection 50 mcg (50 mcg Intravenous Given 05/13/22 2046)    ED Course/ Medical Decision Making/ A&P                             Medical Decision Making Amount and/or Complexity of Data Reviewed Labs: ordered.  Risk Prescription drug management. Decision regarding hospitalization.    52 year old female with medical history significant for anxiety, depression, IV drug abuse, hepatitis C who presents to the emergency department with a chin abscess.  The patient states that for the last 3 days she has noticed worsening pain and swelling underneath her chin.  Her neck has become firm and tight and she feels  significant pressure in her mouth.  She denies any difficulty swallowing, she does have difficulty breathing through her mouth but is able to breathe through her nose.  She does not feel like her throat is closing up.  She denies any fevers or chills.  She does endorse a history of abscesses in the setting of IV drug abuse.  On arrival, the patient was afebrile, not tachycardic or tachypneic, BP 132/81, saturating 100% on room air.  Sinus rhythm noted on cardiac telemetry.  Physical exam concerning for potential Ludwig's Angina with sublingual swelling with no palpable abscess, firm and intense sublingual swelling with tenderness to palpation, mild trismus present, no stridor, no significant elevation of the tongue.  Also considered sialadenitis.  Labs significant for CBC without leukocytosis or anemia, lactic acid 0.8, CMP unremarkable, group A strep pending, blood cultures x 2 collected and pending, MRSA swab collected and pending.  IV Lasix was obtained and the patient was administered an IV fluid bolus, started on IV vancomycin and IV Unasyn due to concern  for clinical Ludwig's Angina.  I did discuss with on-call ENT, Dr. Jearld Fenton who declined the patient as he felt the patient was too complicated for his scope of practice and subsequently he recommended tertiary care referral.  I ordered a CT of the neck with contrast which revealed a sublingual abscess: IMPRESSION:  Inflammatory change and a 1.8 x 1.1 x 1.0 cm abscess in the  sublingual space. Findings can be seen in the setting of Ludwig  Angina.    The patient was administered IV fentanyl and IV Decadron.  I discussed the care of the patient with the [facility] physicians access line and subsequently discussed care of the patient with on-call otolaryngology at [facility], Dr. Allena Katz of otolaryngology who accepted the patient in transfer.  I was informed that there are no current surgical beds available.  Patient will be put on a waiting list for transfer.  On reassessment, the patient continued to be protecting her airway, was not septic, overall clinically stable.   On repeat assessment, the patient no longer had any trismus, felt improved after Decadron and IV fluids and antibiotics.  I was informed that the patient had a bed at [facility] and transport was called.  EMTALA was updated, part for to be completed once transport arrives.  Part 4 EMTALA completed, pt stable on reassessment.  Final Clinical Impression(s) / ED Diagnoses Final diagnoses:  Abscess of chin  Angina, Lawanda Cousins    Rx / DC Orders ED Discharge Orders     None         Ernie Avena, MD 05/13/22 2318    Ernie Avena, MD 05/13/22 2336

## 2022-05-13 NOTE — ED Notes (Signed)
Pt. Has no noted trouble swallowing and no drooling from her mouth.  Pt. In no resp. Distress and is on the monitor.  Pt. Able to talk non stop.  Pt. Has phone in her hand speaking with her sister and then her daughter and her mother.  Pt. Aware of what is going to be done.

## 2022-05-13 NOTE — ED Notes (Signed)
Pt. Has edema under the chin and more edema noted under the R side of chin and neck than the L.  Pt. Now has edema over the R eye lid with redness noted.

## 2022-05-13 NOTE — Progress Notes (Signed)
Pharmacy Antibiotic Note  Alexa Sims is a 52 y.o. female for which pharmacy has been consulted for vancomycin dosing for  Ludwig's angina .  Patient with a history of anxiety, IVDU, Hep C. Patient presenting with chin abscess.  SCr 0.77 WBC 7.1; LA 0.8; T 98.4; HR 62; RR 16  Plan: Unasyn per MD Vancomycin 1500 mg once then 1250 mg q24hr (eAUC 486.1) unless change in renal function Trend WBC, Fever, Renal function F/u cultures, clinical course, WBC De-escalate when able Levels at steady state  Weight: 59.9 kg (132 lb)  Temp (24hrs), Avg:98.4 F (36.9 C), Min:98.4 F (36.9 C), Max:98.4 F (36.9 C)  Recent Labs  Lab 05/13/22 1800  WBC 7.1    CrCl cannot be calculated (Patient's most recent lab result is older than the maximum 21 days allowed.).    Allergies  Allergen Reactions   Tramadol Nausea And Vomiting and Other (See Comments)    Blurred vision   Microbiology results: Pending  Thank you for allowing pharmacy to be a part of this patient's care.  Delmar Landau, PharmD, BCPS 05/13/2022 6:31 PM ED Clinical Pharmacist -  [phone]

## 2022-05-13 NOTE — ED Triage Notes (Signed)
Presents with obvious redness , possible skin infection or abscess in chin area , edema noted . Painful she said

## 2022-05-13 NOTE — ED Notes (Signed)
[facility] for transport at 9:38 pm.

## 2022-05-14 LAB — MRSA NEXT GEN BY PCR, NASAL: MRSA by PCR Next Gen: DETECTED — AB

## 2022-05-14 LAB — GROUP A STREP BY PCR: Group A Strep by PCR: NOT DETECTED

## 2022-05-14 LAB — CULTURE, BLOOD (ROUTINE X 2)

## 2022-05-15 LAB — CULTURE, BLOOD (ROUTINE X 2)

## 2022-05-16 LAB — CULTURE, BLOOD (ROUTINE X 2): Special Requests: ADEQUATE

## 2022-05-17 LAB — CULTURE, BLOOD (ROUTINE X 2)
Culture: NO GROWTH
Culture: NO GROWTH

## 2022-10-29 ENCOUNTER — Other Ambulatory Visit: Payer: Self-pay

## 2022-10-29 ENCOUNTER — Encounter (HOSPITAL_BASED_OUTPATIENT_CLINIC_OR_DEPARTMENT_OTHER): Payer: Self-pay

## 2022-10-29 ENCOUNTER — Emergency Department (HOSPITAL_BASED_OUTPATIENT_CLINIC_OR_DEPARTMENT_OTHER)
Admission: EM | Admit: 2022-10-29 | Discharge: 2022-10-30 | Discharge status: Against Medical Advice | Payer: Medicaid Other | Attending: Emergency Medicine | Admitting: Emergency Medicine

## 2022-10-29 DIAGNOSIS — Z5321 Procedure and treatment not carried out due to patient leaving prior to being seen by health care provider: Secondary | ICD-10-CM | POA: Diagnosis not present

## 2022-10-29 DIAGNOSIS — M542 Cervicalgia: Secondary | ICD-10-CM | POA: Diagnosis not present

## 2022-10-29 DIAGNOSIS — R200 Anesthesia of skin: Secondary | ICD-10-CM | POA: Insufficient documentation

## 2022-10-29 DIAGNOSIS — R519 Headache, unspecified: Secondary | ICD-10-CM | POA: Diagnosis present

## 2022-10-29 NOTE — ED Notes (Signed)
Registration notified staff pt was leaving
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
